# Patient Record
Sex: Male | Born: 1965 | Race: White | Hispanic: No | Marital: Single | Smoking: Never smoker
Health system: Southern US, Community
[De-identification: ages and names within clinical notes are randomized; demographics above are authoritative.]

## PROBLEM LIST (undated history)

## (undated) DIAGNOSIS — M1990 Unspecified osteoarthritis, unspecified site: Secondary | ICD-10-CM

## (undated) DIAGNOSIS — E785 Hyperlipidemia, unspecified: Secondary | ICD-10-CM

## (undated) DIAGNOSIS — K219 Gastro-esophageal reflux disease without esophagitis: Secondary | ICD-10-CM

## (undated) DIAGNOSIS — I10 Essential (primary) hypertension: Secondary | ICD-10-CM

## (undated) DIAGNOSIS — E119 Type 2 diabetes mellitus without complications: Secondary | ICD-10-CM

## (undated) DIAGNOSIS — K5792 Diverticulitis of intestine, part unspecified, without perforation or abscess without bleeding: Secondary | ICD-10-CM

## (undated) HISTORY — DX: Hyperlipidemia, unspecified: E78.5

## (undated) HISTORY — PX: ACHILLES TENDON REPAIR: SUR1153

## (undated) HISTORY — PX: APPENDECTOMY: SHX54

---

## 2012-04-05 ENCOUNTER — Emergency Department: Payer: Self-pay | Admitting: *Deleted

## 2012-04-05 LAB — CBC WITH DIFFERENTIAL/PLATELET
Basophil #: 0.1 10*3/uL (ref 0.0–0.1)
Basophil %: 0.6 %
Eosinophil #: 0.2 10*3/uL (ref 0.0–0.7)
Eosinophil %: 2.7 %
HCT: 45.5 % (ref 40.0–52.0)
HGB: 15.7 g/dL (ref 13.0–18.0)
Lymphocyte #: 2.1 10*3/uL (ref 1.0–3.6)
Lymphocyte %: 23 %
MCH: 30.2 pg (ref 26.0–34.0)
MCHC: 34.5 g/dL (ref 32.0–36.0)
MCV: 88 fL (ref 80–100)
Monocyte #: 0.8 x10 3/mm (ref 0.2–1.0)
Monocyte %: 8.7 %
Neutrophil #: 6 10*3/uL (ref 1.4–6.5)
Neutrophil %: 65 %
Platelet: 170 10*3/uL (ref 150–440)
RBC: 5.2 10*6/uL (ref 4.40–5.90)
RDW: 13.1 % (ref 11.5–14.5)
WBC: 9.3 10*3/uL (ref 3.8–10.6)

## 2012-04-05 LAB — URIC ACID: Uric Acid: 6.8 mg/dL (ref 3.5–7.2)

## 2012-04-05 LAB — SEDIMENTATION RATE: Erythrocyte Sed Rate: 4 mm/hr (ref 0–15)

## 2013-11-13 ENCOUNTER — Emergency Department: Payer: Self-pay | Admitting: Emergency Medicine

## 2013-11-13 LAB — CBC WITH DIFFERENTIAL/PLATELET
Basophil #: 0.1 10*3/uL (ref 0.0–0.1)
Basophil %: 0.8 %
Eosinophil #: 0.3 10*3/uL (ref 0.0–0.7)
Eosinophil %: 3.5 %
HCT: 50.2 % (ref 40.0–52.0)
HGB: 17.3 g/dL (ref 13.0–18.0)
Lymphocyte #: 2.3 10*3/uL (ref 1.0–3.6)
Lymphocyte %: 23.6 %
MCH: 29.2 pg (ref 26.0–34.0)
MCHC: 34.4 g/dL (ref 32.0–36.0)
MCV: 85 fL (ref 80–100)
Monocyte #: 0.8 x10 3/mm (ref 0.2–1.0)
Monocyte %: 8.1 %
Neutrophil #: 6.2 10*3/uL (ref 1.4–6.5)
Neutrophil %: 64 %
Platelet: 216 10*3/uL (ref 150–440)
RBC: 5.92 10*6/uL — ABNORMAL HIGH (ref 4.40–5.90)
RDW: 13.2 % (ref 11.5–14.5)
WBC: 9.6 10*3/uL (ref 3.8–10.6)

## 2013-11-13 LAB — URINALYSIS, COMPLETE
Bacteria: NONE SEEN
Bilirubin,UR: NEGATIVE
Blood: NEGATIVE
Glucose,UR: NEGATIVE mg/dL (ref 0–75)
Ketone: NEGATIVE
Leukocyte Esterase: NEGATIVE
Nitrite: NEGATIVE
Ph: 5 (ref 4.5–8.0)
Protein: 25
RBC,UR: 1 /HPF (ref 0–5)
Specific Gravity: 1.025 (ref 1.003–1.030)
Squamous Epithelial: NONE SEEN
WBC UR: <1 /HPF (ref 0–5)

## 2013-11-13 LAB — COMPREHENSIVE METABOLIC PANEL
Albumin: 4.5 g/dL (ref 3.4–5.0)
Alkaline Phosphatase: 92 U/L
Anion Gap: 3 — ABNORMAL LOW (ref 7–16)
BUN: 14 mg/dL (ref 7–18)
Bilirubin,Total: 1.3 mg/dL — ABNORMAL HIGH (ref 0.2–1.0)
Calcium, Total: 9.3 mg/dL (ref 8.5–10.1)
Chloride: 103 mmol/L (ref 98–107)
Co2: 31 mmol/L (ref 21–32)
Creatinine: 1.14 mg/dL (ref 0.60–1.30)
EGFR (African American): >60
EGFR (Non-African Amer.): >60
Glucose: 117 mg/dL — ABNORMAL HIGH (ref 65–99)
Osmolality: 275 (ref 275–301)
Potassium: 3.9 mmol/L (ref 3.5–5.1)
SGOT(AST): 43 U/L — ABNORMAL HIGH (ref 15–37)
SGPT (ALT): 78 U/L (ref 12–78)
Sodium: 137 mmol/L (ref 136–145)
Total Protein: 8.6 g/dL — ABNORMAL HIGH (ref 6.4–8.2)

## 2013-11-13 LAB — LIPASE, BLOOD: Lipase: 172 U/L (ref 73–393)

## 2013-11-13 LAB — CLOSTRIDIUM DIFFICILE(ARMC)

## 2013-11-16 LAB — STOOL CULTURE

## 2015-01-20 ENCOUNTER — Emergency Department: Payer: Self-pay | Admitting: Emergency Medicine

## 2015-11-22 ENCOUNTER — Encounter: Payer: Self-pay | Admitting: Emergency Medicine

## 2015-11-22 ENCOUNTER — Emergency Department
Admission: EM | Admit: 2015-11-22 | Discharge: 2015-11-22 | Disposition: A | Discharge status: Home / Self Care | Payer: Managed Care, Other (non HMO) | Attending: Emergency Medicine | Admitting: Emergency Medicine

## 2015-11-22 DIAGNOSIS — I10 Essential (primary) hypertension: Secondary | ICD-10-CM | POA: Diagnosis not present

## 2015-11-22 DIAGNOSIS — R42 Dizziness and giddiness: Secondary | ICD-10-CM | POA: Diagnosis present

## 2015-11-22 DIAGNOSIS — R55 Syncope and collapse: Secondary | ICD-10-CM | POA: Insufficient documentation

## 2015-11-22 DIAGNOSIS — G8929 Other chronic pain: Secondary | ICD-10-CM | POA: Insufficient documentation

## 2015-11-22 DIAGNOSIS — E119 Type 2 diabetes mellitus without complications: Secondary | ICD-10-CM | POA: Diagnosis not present

## 2015-11-22 DIAGNOSIS — M25512 Pain in left shoulder: Secondary | ICD-10-CM | POA: Diagnosis not present

## 2015-11-22 HISTORY — DX: Type 2 diabetes mellitus without complications: E11.9

## 2015-11-22 HISTORY — DX: Essential (primary) hypertension: I10

## 2015-11-22 LAB — BASIC METABOLIC PANEL
Anion gap: 9 (ref 5–15)
BUN: 16 mg/dL (ref 6–20)
CO2: 26 mmol/L (ref 22–32)
Calcium: 9.9 mg/dL (ref 8.9–10.3)
Chloride: 98 mmol/L — ABNORMAL LOW (ref 101–111)
Creatinine, Ser: 1.25 mg/dL — ABNORMAL HIGH (ref 0.61–1.24)
GFR calc Af Amer: >60 mL/min (ref >60)
GFR calc non Af Amer: >60 mL/min (ref >60)
Glucose, Bld: 126 mg/dL — ABNORMAL HIGH (ref 65–99)
Potassium: 4.1 mmol/L (ref 3.5–5.1)
Sodium: 133 mmol/L — ABNORMAL LOW (ref 135–145)

## 2015-11-22 LAB — CBC
HCT: 49.6 % (ref 40.0–52.0)
Hemoglobin: 16.8 g/dL (ref 13.0–18.0)
MCH: 29.1 pg (ref 26.0–34.0)
MCHC: 33.8 g/dL (ref 32.0–36.0)
MCV: 85.9 fL (ref 80.0–100.0)
Platelets: 252 10*3/uL (ref 150–440)
RBC: 5.77 MIL/uL (ref 4.40–5.90)
RDW: 12.8 % (ref 11.5–14.5)
WBC: 13.9 10*3/uL — ABNORMAL HIGH (ref 3.8–10.6)

## 2015-11-22 LAB — TROPONIN I: Troponin I: <0.03 ng/mL (ref <0.031)

## 2015-11-22 NOTE — ED Notes (Signed)
Lab notified of add on troponin.

## 2015-11-22 NOTE — Discharge Instructions (Signed)

## 2015-11-22 NOTE — ED Provider Notes (Signed)
[facility] Emergency Department Provider Note  ____________________________________________  Time seen: Approximately 340 PM  I have reviewed the triage vital signs and the nursing notes.   HISTORY  Chief Complaint Dizziness    HPI Edward Quinn is a 49 y.o. male with a history of diabetes and hypertension recently started on lisinopril and HCTZ who is presenting today with near syncope. 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 He says that he was pushing heavy equipment was sweaty and began to feel lightheaded as if she was going to pass out. The patient denies any chest pain or shortness of breath. Said that he removed himself from the environment and took by mouth fluids and began feeling better within about 10-15 minutes. He feels back to his baseline at this point. So denying any chest pain or shortness of breath. 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 Seen by his primary care doctor who sent him into the emergency department today as a precaution. Patient arrives with paperwork as well as EKGs from today and December 19.  Past Medical History  Diagnosis Date  . Diabetes mellitus without complication (HCC)   . Hypertension     There are no active problems to display for this patient.   Past Surgical History  Procedure Laterality Date  . Appendectomy      No current outpatient prescriptions on file.  Allergies Review of patient's allergies indicates no known allergies.  No family history on file.  Social History Social History  Substance Use Topics  . Smoking status: Never Smoker   . Smokeless tobacco: None  . Alcohol Use: Yes    Review of Systems Constitutional: No fever/chills Eyes: No visual changes. ENT: No sore  throat. Cardiovascular: Denies chest pain. Respiratory: Denies shortness of breath. Gastrointestinal: No abdominal pain.  No nausea, no vomiting.  No diarrhea.  No constipation. Genitourinary: Negative for dysuria. Musculoskeletal: Negative for back pain. Skin: Negative for rash. Neurological: Negative for headaches, focal weakness or numbness.  10-point ROS otherwise negative.  ____________________________________________   PHYSICAL EXAM:  VITAL SIGNS: ED Triage Vitals  Enc Vitals Group     BP 11/22/15 1344 136/87 mmHg     Pulse Rate 11/22/15 1344 82     Resp 11/22/15 1344 20     Temp 11/22/15 1346 97.5 F (36.4 C)     Temp Source 11/22/15 1344 Oral     SpO2 11/22/15 1344 98 %     Weight 11/22/15 1344 175 lb (79.379 kg)     Height 11/22/15 1344  (1.676 m)     Head Cir --      Peak Flow --      Pain Score --      Pain Loc --      Pain Edu? --      Excl. in GC? --     Constitutional: Alert and oriented. Well appearing and in no acute distress. Eyes: Conjunctivae are normal. PERRL. EOMI. Head: Atraumatic. Nose: No congestion/rhinnorhea. Mouth/Throat: Mucous membranes are moist.   Neck: No stridor.   Cardiovascular: Normal rate, regular rhythm. Grossly normal heart sounds.  Good peripheral circulation. Respiratory: Normal respiratory effort.  No retractions. Lungs CTAB. Gastrointestinal: Soft and nontender. No distention. No abdominal bruits. No  CVA tenderness. Musculoskeletal: No lower extremity tenderness nor edema.  No joint effusions. Neurologic:  Normal speech and language. No gross focal neurologic deficits are appreciated. No gait instability. Skin:  Skin is warm, dry and intact. No rash noted. Psychiatric: Mood and affect are normal. Speech and behavior are normal.  ____________________________________________   LABS (all labs ordered are listed, but only abnormal results are displayed)  Labs Reviewed  BASIC METABOLIC PANEL - Abnormal; Notable for the  following:    Sodium 133 (*)    Chloride 98 (*)    Glucose, Bld 126 (*)    Creatinine, Ser 1.25 (*)    All other components within normal limits  CBC - Abnormal; Notable for the following:    WBC 13.9 (*)    All other components within normal limits  TROPONIN I   ____________________________________________  EKG  ED ECG REPORT I, Arelia LongestSchaevitz,  David M, the attending physician, personally viewed and interpreted this ECG.   Date: 11/22/2015  EKG Time: 1355  Rate: 79  Rhythm: normal sinus rhythm  Axis: Normal axis  Intervals:none  ST&T Change: No ST segment elevation or depression. No abnormal T-wave inversion.  Compared with EKGs from December 22 in December 1900 on an outpatient basis and they both had a T-wave inversion in lead 3 which can be a normal variant.  ____________________________________________  RADIOLOGY   ____________________________________________   PROCEDURES  ____________________________________________   INITIAL IMPRESSION / ASSESSMENT AND PLAN / ED COURSE  Pertinent labs & imaging results that were available during my care of the patient were reviewed by me and considered in my medical decision making (see chart for details).  Given the context of the episodes this morning a believe that the patient likely had a vasovagal episode. He was in a hot environment had not had sufficient fluid and solid intake prior to working hard this morning. There is also likely some contribution of his new blood pressure medications, however I do think that he should be on these since he says that his previous blood pressures were up in the 160s. Instead of stopping the blood pressure medications we discussed making sure to not skip meals and to make sure he drinks plenty of fluids especially water. The patient does not drink very much water at this time. He understands the plan and is willing to comply. His wife is also the bedside and understands the plan.  Says he plans  to follow up with his primary care doctor in over the next several days. ____________________________________________   FINAL CLINICAL IMPRESSION(S) / ED DIAGNOSES  Near-syncope.    Myrna Blazeravid Matthew Schaevitz, MD 11/22/15 [phone]

## 2015-11-22 NOTE — ED Notes (Signed)
Pt presents with episode of dizziness today at work, pt recently started on 6 new meds some being bp meds. Pt was doing some different physical work today and felt lightheaded. Pt states all sx have resolved at this time, but was advised to come and checked out.

## 2016-04-29 ENCOUNTER — Encounter: Payer: Self-pay | Admitting: Medical Oncology

## 2016-04-29 ENCOUNTER — Emergency Department: Payer: Managed Care, Other (non HMO)

## 2016-04-29 ENCOUNTER — Emergency Department
Admission: EM | Admit: 2016-04-29 | Discharge: 2016-04-29 | Disposition: A | Discharge status: Home / Self Care | Payer: Managed Care, Other (non HMO) | Attending: Emergency Medicine | Admitting: Emergency Medicine

## 2016-04-29 DIAGNOSIS — K5732 Diverticulitis of large intestine without perforation or abscess without bleeding: Secondary | ICD-10-CM | POA: Diagnosis not present

## 2016-04-29 DIAGNOSIS — I10 Essential (primary) hypertension: Secondary | ICD-10-CM | POA: Insufficient documentation

## 2016-04-29 DIAGNOSIS — E119 Type 2 diabetes mellitus without complications: Secondary | ICD-10-CM | POA: Diagnosis not present

## 2016-04-29 DIAGNOSIS — R1030 Lower abdominal pain, unspecified: Secondary | ICD-10-CM

## 2016-04-29 DIAGNOSIS — R1032 Left lower quadrant pain: Secondary | ICD-10-CM | POA: Diagnosis present

## 2016-04-29 LAB — URINALYSIS COMPLETE WITH MICROSCOPIC (ARMC ONLY)
Bacteria, UA: NONE SEEN
Bilirubin Urine: NEGATIVE
Glucose, UA: NEGATIVE mg/dL
Hgb urine dipstick: NEGATIVE
Ketones, ur: NEGATIVE mg/dL
Leukocytes, UA: NEGATIVE
Nitrite: NEGATIVE
Protein, ur: NEGATIVE mg/dL
RBC / HPF: NONE SEEN RBC/hpf (ref 0–5)
Specific Gravity, Urine: 1.021 (ref 1.005–1.030)
Squamous Epithelial / LPF: NONE SEEN
pH: 5 (ref 5.0–8.0)

## 2016-04-29 LAB — COMPREHENSIVE METABOLIC PANEL
ALT: 27 U/L (ref 17–63)
AST: 26 U/L (ref 15–41)
Albumin: 5.1 g/dL — ABNORMAL HIGH (ref 3.5–5.0)
Alkaline Phosphatase: 67 U/L (ref 38–126)
Anion gap: 8 (ref 5–15)
BUN: 12 mg/dL (ref 6–20)
CO2: 28 mmol/L (ref 22–32)
Calcium: 9.9 mg/dL (ref 8.9–10.3)
Chloride: 100 mmol/L — ABNORMAL LOW (ref 101–111)
Creatinine, Ser: 1.07 mg/dL (ref 0.61–1.24)
GFR calc Af Amer: >60 mL/min (ref >60)
GFR calc non Af Amer: >60 mL/min (ref >60)
Glucose, Bld: 118 mg/dL — ABNORMAL HIGH (ref 65–99)
Potassium: 4.1 mmol/L (ref 3.5–5.1)
Sodium: 136 mmol/L (ref 135–145)
Total Bilirubin: 1.4 mg/dL — ABNORMAL HIGH (ref 0.3–1.2)
Total Protein: 8.4 g/dL — ABNORMAL HIGH (ref 6.5–8.1)

## 2016-04-29 LAB — CBC
HCT: 47.3 % (ref 40.0–52.0)
Hemoglobin: 16 g/dL (ref 13.0–18.0)
MCH: 29.4 pg (ref 26.0–34.0)
MCHC: 33.8 g/dL (ref 32.0–36.0)
MCV: 86.9 fL (ref 80.0–100.0)
Platelets: 196 10*3/uL (ref 150–440)
RBC: 5.45 MIL/uL (ref 4.40–5.90)
RDW: 13.2 % (ref 11.5–14.5)
WBC: 15.7 10*3/uL — ABNORMAL HIGH (ref 3.8–10.6)

## 2016-04-29 LAB — LIPASE, BLOOD: Lipase: 30 U/L (ref 11–51)

## 2016-04-29 MED ORDER — HYDROCODONE-ACETAMINOPHEN 5-325 MG PO TABS: 1.0000 | ORAL_TABLET | Freq: Four times a day (QID) | ORAL | Status: DC | PRN

## 2016-04-29 MED ORDER — METRONIDAZOLE 500 MG PO TABS
500.0000 mg | ORAL_TABLET | Freq: Once | ORAL | Status: AC
  Administered 2016-04-29: 500 mg via ORAL
  Filled 2016-04-29: qty 1

## 2016-04-29 MED ORDER — CIPROFLOXACIN HCL 500 MG PO TABS: 500.0000 mg | ORAL_TABLET | Freq: Two times a day (BID) | ORAL | Status: DC

## 2016-04-29 MED ORDER — IOPAMIDOL (ISOVUE-300) INJECTION 61%
100.0000 mL | Freq: Once | INTRAVENOUS | Status: AC | PRN
  Administered 2016-04-29: 100 mL via INTRAVENOUS

## 2016-04-29 MED ORDER — CIPROFLOXACIN HCL 500 MG PO TABS
500.0000 mg | ORAL_TABLET | Freq: Once | ORAL | Status: AC
  Administered 2016-04-29: 500 mg via ORAL
  Filled 2016-04-29: qty 1

## 2016-04-29 MED ORDER — METRONIDAZOLE 500 MG PO TABS: 500.0000 mg | ORAL_TABLET | Freq: Two times a day (BID) | ORAL | Status: DC

## 2016-04-29 MED ORDER — ONDANSETRON HCL 4 MG PO TABS: 4.0000 mg | ORAL_TABLET | Freq: Three times a day (TID) | ORAL | Status: DC | PRN

## 2016-04-29 MED ORDER — DIATRIZOATE MEGLUMINE & SODIUM 66-10 % PO SOLN
15.0000 mL | Freq: Once | ORAL | Status: AC
  Administered 2016-04-29: 15 mL via ORAL

## 2016-04-29 NOTE — Discharge Instructions (Signed)
You are being treated for infection of the large intestine, diverticulitis. Next I return to the emergency room for any worsening pain, black or bloody stools, fever, dizziness or passing out, or any other symptoms concerning to you.   Diverticulitis Diverticulitis is when small pockets that have formed in your colon (large intestine) become infected or swollen. HOME CARE  Follow your doctor's instructions.  Follow a special diet if told by your doctor.  When you feel better, your doctor may tell you to change your diet. You may be told to eat a lot of fiber. Fruits and vegetables are good sources of fiber. Fiber makes it easier to poop (have bowel movements).  Take supplements or probiotics as told by your doctor.  Only take medicines as told by your doctor.  Keep all follow-up visits with your doctor. GET HELP IF:  Your pain does not get better.  You have a hard time eating food.  You are not pooping like normal. GET HELP RIGHT AWAY IF:  Your pain gets worse.  Your problems do not get better.  Your problems suddenly get worse.  You have a fever.  You keep throwing up (vomiting).  You have bloody or black, tarry poop (stool). MAKE SURE YOU:   Understand these instructions.  Will watch your condition.  Will get help right away if you are not doing well or get worse.   This information is not intended to replace advice given to you by your health care provider. Make sure you discuss any questions you have with your health care provider.   Document Released: 05/05/2008 Document Revised: 11/22/2013 Document Reviewed: 10/12/2013 Elsevier Interactive Patient Education [employer]2016 Elsevier Inc.

## 2016-04-29 NOTE — ED Provider Notes (Signed)
[facility]  I accepted care from Dr. Scotty CourtStafford  ____________________________________________    RADIOLOGY All xrays were viewed by me. Imaging interpreted by radiologist.  CT abdomen and pelvis with contrast:   IMPRESSION: Focal sigmoid diverticulitis. No abscess is noted.  ____________________________________________   PROCEDURES  Procedure(s) performed: None  Critical Care performed: None  ____________________________________________   INITIAL IMPRESSION / ASSESSMENT AND PLAN / ED COURSE   Pertinent labs & imaging results that were available during my care of the patient were reviewed by me and considered in my medical decision making (see chart for details).  I discussed diagnosis of diverticulitis with the patient and family member, treatment and follow-up precautions. Patient overall well-appearing, no hypotension, tachycardia, unmanaged pain, and I think patient is okay for outpatient treatment.  CONSULTATIONS: None    Patient / Family / Caregiver informed of clinical course, medical decision-making process, and agree with plan.   I discussed return precautions, follow-up instructions, and discharged instructions with patient and/or family.     ____________________________________________   FINAL CLINICAL IMPRESSION(S) / ED DIAGNOSES  Final diagnoses:  Lower abdominal pain  Diverticulitis of sigmoid colon        Governor Rooksebecca Gaberial Cada, MD 04/29/16 1700

## 2016-04-29 NOTE — ED Notes (Signed)
Pt reports that he has been having lower abd pain/pressure since yesterday am. Reports nausea without vomiting, denies diarrhea.

## 2016-04-29 NOTE — ED Notes (Signed)
Pt up to bathroom without assistance.  Called ct and notified pt finished with contrast.

## 2016-04-29 NOTE — ED Provider Notes (Signed)
[facility] Emergency Department Provider Note  ____________________________________________  Time seen: 1:45 PM  I have reviewed the triage vital signs and the nursing notes.   HISTORY  Chief Complaint Abdominal Pain    HPI Edward Quinn is a 50 y.o. male who complains of lower abdominal pain and pressure since yesterday morning. He has nausea but no vomiting or diarrhea. No fever chills or sweats. No other pains. He is tolerating oral intake has decreased appetite. Feels like he needs to have a bowel movement, although he last had a bowel movement 1 AM today which was unremarkable for him. He also states that he feels like he has the urge to urinate, but his urination is normal and not painful or bloody. No hesitancy or difficulty with stream. Pain is moderate intensity, intermittent, achy, pressure, nonradiating. No aggravating or alleviating factors.    Past Medical History  Diagnosis Date  . Diabetes mellitus without complication (HCC)   . Hypertension      There are no active problems to display for this patient.    Past Surgical History  Procedure Laterality Date  . Appendectomy       No current outpatient prescriptions on file. None  Allergies Review of patient's allergies indicates no known allergies.   No family history on file.  Social History Social History  Substance Use Topics  . Smoking status: Never Smoker   . Smokeless tobacco: None  . Alcohol Use: Yes    Review of Systems  Constitutional:   No fever or chills.  Eyes:   No vision changes.  ENT:   No sore throat. No rhinorrhea. Cardiovascular:   No chest pain. Respiratory:   No dyspnea or cough. Gastrointestinal:   Positive abdominal pain as above without vomiting and diarrhea.  Genitourinary:   Negative for dysuria or difficulty urinating. Musculoskeletal:   Negative for focal pain or swelling Neurological:   Negative for headaches 10-point ROS otherwise  negative.  ____________________________________________   PHYSICAL EXAM:  VITAL SIGNS: ED Triage Vitals  Enc Vitals Group     BP 04/29/16 1033 120/87 mmHg     Pulse Rate 04/29/16 1033 84     Resp 04/29/16 1033 16     Temp 04/29/16 1033 98.2 F (36.8 C)     Temp Source 04/29/16 1033 Oral     SpO2 04/29/16 1033 98 %     Weight 04/29/16 1033 165 lb (74.844 kg)     Height 04/29/16 1033 5\' 6"  (1.676 m)     Head Cir --      Peak Flow --      Pain Score 04/29/16 1034 7     Pain Loc --      Pain Edu? --      Excl. in GC? --     Vital signs reviewed, nursing assessments reviewed.   Constitutional:   Alert and oriented. Well appearing and in no distress. Eyes:   No scleral icterus. No conjunctival pallor. PERRL. EOMI.  No nystagmus. ENT   Head:   Normocephalic and atraumatic.   Nose:   No congestion/rhinnorhea. No septal hematoma   Mouth/Throat:   MMM, no pharyngeal erythema. No peritonsillar mass.    Neck:   No stridor. No SubQ emphysema. No meningismus. Hematological/Lymphatic/Immunilogical:   No cervical lymphadenopathy. Cardiovascular:   RRR. Symmetric bilateral radial and DP pulses.  No murmurs.  Respiratory:   Normal respiratory effort without tachypnea nor retractions. Breath sounds are clear and equal bilaterally. No wheezes/rales/rhonchi. Gastrointestinal:  Soft with suprapubic and left lower quadrant tenderness. Non distended. There is no CVA tenderness.  No rebound, rigidity, or guarding. Genitourinary:   deferred Musculoskeletal:   Nontender with normal range of motion in all extremities. No joint effusions.  No lower extremity tenderness.  No edema. Neurologic:   Normal speech and language.  CN 2-10 normal. Motor grossly intact. No gross focal neurologic deficits are appreciated.  Skin:    Skin is warm, dry and intact. No rash noted.  No petechiae, purpura, or bullae.  ____________________________________________    LABS (pertinent  positives/negatives) (all labs ordered are listed, but only abnormal results are displayed) Labs Reviewed  COMPREHENSIVE METABOLIC PANEL - Abnormal; Notable for the following:    Chloride 100 (*)    Glucose, Bld 118 (*)    Total Protein 8.4 (*)    Albumin 5.1 (*)    Total Bilirubin 1.4 (*)    All other components within normal limits  CBC - Abnormal; Notable for the following:    WBC 15.7 (*)    All other components within normal limits  URINALYSIS COMPLETEWITH MICROSCOPIC ([facility] ONLY) - Abnormal; Notable for the following:    Color, Urine YELLOW (*)    APPearance CLEAR (*)    All other components within normal limits  LIPASE, BLOOD   ____________________________________________   EKG    ____________________________________________    RADIOLOGY  CT abdomen and pelvis pending  ____________________________________________   PROCEDURES   ____________________________________________   INITIAL IMPRESSION / ASSESSMENT AND PLAN / ED COURSE  Pertinent labs & imaging results that were available during my care of the patient were reviewed by me and considered in my medical decision making (see chart for details).  Patient presents with lower abdominal pain and tenderness. He's had previous abdominal surgery. Vital signs are normal, initial lab workup is normal, but due to ongoing symptoms and prior surgeries and diabetes which could result in some immunocompromise, we'll proceed with CT scan of the abdomen pelvis. If this does not reveal any severe abnormalities, the patient appears to be stable for discharge home and close follow-up with primary care given that he is not in distress and well-appearing with normal vital signs.  Care the patient signed on the Dr. Shaune Pollack at 3:30 PM   ____________________________________________   FINAL CLINICAL IMPRESSION(S) / ED DIAGNOSES  Final diagnoses:  Lower abdominal pain       Portions of this note were generated with dragon  dictation software. Dictation errors may occur despite best attempts at proofreading.   Sharman Cheek, MD 04/29/16 [phone]

## 2016-05-03 ENCOUNTER — Encounter: Payer: Self-pay | Admitting: Emergency Medicine

## 2016-05-03 ENCOUNTER — Emergency Department: Payer: Managed Care, Other (non HMO)

## 2016-05-03 ENCOUNTER — Observation Stay
Admission: EM | Admit: 2016-05-03 | Discharge: 2016-05-05 | Disposition: A | Discharge status: Home / Self Care | Payer: Managed Care, Other (non HMO) | Attending: Surgery | Admitting: Surgery

## 2016-05-03 DIAGNOSIS — I10 Essential (primary) hypertension: Secondary | ICD-10-CM | POA: Diagnosis not present

## 2016-05-03 DIAGNOSIS — K760 Fatty (change of) liver, not elsewhere classified: Secondary | ICD-10-CM | POA: Insufficient documentation

## 2016-05-03 DIAGNOSIS — I700 Atherosclerosis of aorta: Secondary | ICD-10-CM | POA: Diagnosis not present

## 2016-05-03 DIAGNOSIS — Z7984 Long term (current) use of oral hypoglycemic drugs: Secondary | ICD-10-CM | POA: Diagnosis not present

## 2016-05-03 DIAGNOSIS — Z9049 Acquired absence of other specified parts of digestive tract: Secondary | ICD-10-CM | POA: Insufficient documentation

## 2016-05-03 DIAGNOSIS — K5720 Diverticulitis of large intestine with perforation and abscess without bleeding: Principal | ICD-10-CM | POA: Insufficient documentation

## 2016-05-03 DIAGNOSIS — N400 Enlarged prostate without lower urinary tract symptoms: Secondary | ICD-10-CM | POA: Diagnosis not present

## 2016-05-03 DIAGNOSIS — Z79899 Other long term (current) drug therapy: Secondary | ICD-10-CM | POA: Diagnosis not present

## 2016-05-03 DIAGNOSIS — N179 Acute kidney failure, unspecified: Secondary | ICD-10-CM

## 2016-05-03 DIAGNOSIS — E860 Dehydration: Secondary | ICD-10-CM | POA: Diagnosis not present

## 2016-05-03 DIAGNOSIS — E119 Type 2 diabetes mellitus without complications: Secondary | ICD-10-CM | POA: Insufficient documentation

## 2016-05-03 LAB — CBC WITH DIFFERENTIAL/PLATELET
Basophils Absolute: 0 10*3/uL (ref 0–0.1)
Basophils Relative: 1 %
Eosinophils Absolute: 0.1 10*3/uL (ref 0–0.7)
Eosinophils Relative: 1 %
HCT: 43.4 % (ref 40.0–52.0)
Hemoglobin: 15 g/dL (ref 13.0–18.0)
Lymphocytes Relative: 13 %
Lymphs Abs: 1.3 10*3/uL (ref 1.0–3.6)
MCH: 29.7 pg (ref 26.0–34.0)
MCHC: 34.5 g/dL (ref 32.0–36.0)
MCV: 86.1 fL (ref 80.0–100.0)
Monocytes Absolute: 0.6 10*3/uL (ref 0.2–1.0)
Monocytes Relative: 6 %
Neutro Abs: 7.9 10*3/uL — ABNORMAL HIGH (ref 1.4–6.5)
Neutrophils Relative %: 79 %
Platelets: 236 10*3/uL (ref 150–440)
RBC: 5.05 MIL/uL (ref 4.40–5.90)
RDW: 13.1 % (ref 11.5–14.5)
WBC: 10 10*3/uL (ref 3.8–10.6)

## 2016-05-03 LAB — COMPREHENSIVE METABOLIC PANEL
ALT: 24 U/L (ref 17–63)
AST: 35 U/L (ref 15–41)
Albumin: 4.7 g/dL (ref 3.5–5.0)
Alkaline Phosphatase: 53 U/L (ref 38–126)
Anion gap: 13 (ref 5–15)
BUN: 19 mg/dL (ref 6–20)
CO2: 22 mmol/L (ref 22–32)
Calcium: 9 mg/dL (ref 8.9–10.3)
Chloride: 102 mmol/L (ref 101–111)
Creatinine, Ser: 1.36 mg/dL — ABNORMAL HIGH (ref 0.61–1.24)
GFR calc Af Amer: >60 mL/min (ref >60)
GFR calc non Af Amer: 59 mL/min — ABNORMAL LOW (ref >60)
Glucose, Bld: 182 mg/dL — ABNORMAL HIGH (ref 65–99)
Potassium: 3.8 mmol/L (ref 3.5–5.1)
Sodium: 137 mmol/L (ref 135–145)
Total Bilirubin: 0.8 mg/dL (ref 0.3–1.2)
Total Protein: 7.7 g/dL (ref 6.5–8.1)

## 2016-05-03 LAB — GLUCOSE, CAPILLARY: Glucose-Capillary: 122 mg/dL — ABNORMAL HIGH (ref 65–99)

## 2016-05-03 MED ORDER — IOPAMIDOL (ISOVUE-300) INJECTION 61%
100.0000 mL | Freq: Once | INTRAVENOUS | Status: AC | PRN
  Administered 2016-05-03: 100 mL via INTRAVENOUS

## 2016-05-03 MED ORDER — MORPHINE SULFATE (PF) 2 MG/ML IV SOLN: 2.0000 mg | INTRAVENOUS | Status: DC | PRN

## 2016-05-03 MED ORDER — PROMETHAZINE HCL 25 MG/ML IJ SOLN
12.5000 mg | Freq: Once | INTRAMUSCULAR | Status: DC
  Filled 2016-05-03: qty 1

## 2016-05-03 MED ORDER — KETOROLAC TROMETHAMINE 30 MG/ML IJ SOLN: 30.0000 mg | Freq: Four times a day (QID) | INTRAMUSCULAR | Status: DC | PRN

## 2016-05-03 MED ORDER — ACETAMINOPHEN 650 MG RE SUPP
650.0000 mg | Freq: Four times a day (QID) | RECTAL | Status: DC | PRN
Start: 2016-05-03 — End: 2016-05-05

## 2016-05-03 MED ORDER — OXYCODONE HCL 5 MG PO TABS: 5.0000 mg | ORAL_TABLET | ORAL | Status: DC | PRN

## 2016-05-03 MED ORDER — PIPERACILLIN-TAZOBACTAM 3.375 G IVPB
3.3750 g | Freq: Three times a day (TID) | INTRAVENOUS | Status: DC
  Administered 2016-05-04 – 2016-05-05 (×4): 3.375 g via INTRAVENOUS
  Filled 2016-05-03 (×6): qty 50

## 2016-05-03 MED ORDER — INSULIN ASPART 100 UNIT/ML ~~LOC~~ SOLN: 0.0000 [IU] | Freq: Three times a day (TID) | SUBCUTANEOUS | Status: DC

## 2016-05-03 MED ORDER — SODIUM CHLORIDE 0.9 % IV BOLUS (SEPSIS)
1000.0000 mL | Freq: Once | INTRAVENOUS | Status: AC
  Administered 2016-05-03: 1000 mL via INTRAVENOUS

## 2016-05-03 MED ORDER — METOCLOPRAMIDE HCL 5 MG/ML IJ SOLN
10.0000 mg | Freq: Once | INTRAMUSCULAR | Status: AC
Start: 2016-05-03 — End: 2016-05-03
  Administered 2016-05-03: 10 mg via INTRAVENOUS
  Filled 2016-05-03: qty 2

## 2016-05-03 MED ORDER — ACETAMINOPHEN 325 MG PO TABS
650.0000 mg | ORAL_TABLET | Freq: Four times a day (QID) | ORAL | Status: DC | PRN
  Administered 2016-05-03 – 2016-05-04 (×2): 650 mg via ORAL
  Filled 2016-05-03 (×2): qty 2

## 2016-05-03 MED ORDER — PIPERACILLIN-TAZOBACTAM 3.375 G IVPB 30 MIN
3.3750 g | Freq: Once | INTRAVENOUS | Status: AC
  Administered 2016-05-03: 3.375 g via INTRAVENOUS
  Filled 2016-05-03: qty 50

## 2016-05-03 MED ORDER — SODIUM CHLORIDE 0.9 % IV BOLUS (SEPSIS): 1000.0000 mL | Freq: Once | INTRAVENOUS | Status: AC

## 2016-05-03 MED ORDER — ONDANSETRON HCL 4 MG/2ML IJ SOLN: 4.0000 mg | Freq: Four times a day (QID) | INTRAMUSCULAR | Status: DC | PRN

## 2016-05-03 MED ORDER — DIATRIZOATE MEGLUMINE & SODIUM 66-10 % PO SOLN
15.0000 mL | Freq: Once | ORAL | Status: AC
  Administered 2016-05-03: 15 mL via ORAL

## 2016-05-03 MED ORDER — DIPHENHYDRAMINE HCL 25 MG PO CAPS
50.0000 mg | ORAL_CAPSULE | Freq: Two times a day (BID) | ORAL | Status: DC | PRN
  Administered 2016-05-03: 50 mg via ORAL
  Filled 2016-05-03: qty 2

## 2016-05-03 MED ORDER — LACTATED RINGERS IV SOLN
INTRAVENOUS | Status: DC
  Administered 2016-05-03 – 2016-05-05 (×4): via INTRAVENOUS

## 2016-05-03 MED ORDER — FAMOTIDINE IN NACL 20-0.9 MG/50ML-% IV SOLN
20.0000 mg | Freq: Two times a day (BID) | INTRAVENOUS | Status: DC
  Administered 2016-05-03 – 2016-05-05 (×4): 20 mg via INTRAVENOUS
  Filled 2016-05-03 (×6): qty 50

## 2016-05-03 MED ORDER — ENOXAPARIN SODIUM 40 MG/0.4ML ~~LOC~~ SOLN
40.0000 mg | SUBCUTANEOUS | Status: DC
  Filled 2016-05-03: qty 0.4

## 2016-05-03 MED ORDER — ONDANSETRON 8 MG PO TBDP: 4.0000 mg | ORAL_TABLET | Freq: Four times a day (QID) | ORAL | Status: DC | PRN

## 2016-05-03 NOTE — ED Notes (Signed)
Patient states was seen 4 days ago for abd pain, diagnosed with diverticulitis, on antibiotic, pain improved, continues nauseated.

## 2016-05-03 NOTE — ED Provider Notes (Addendum)
[facility] Emergency Department Provider Note  ____________________________________________   I have reviewed the triage vital signs and the nursing notes.   HISTORY  Chief Complaint Abdominal Pain    HPI Edward Quinn is a 50 y.o. male who was diagnosed with focal sigmoid diverticulosis onMay 30 presents today with ongoing discomfort which is somewhat improved. He states that unless he walks the wrong way or touches it the wrong way his abdominal pain is feeling better. He has not vomited but his nausea is getting worse. He is not helped by Zofran is why he is here. He does have ongoing watery diarrhea with no bleeding. Denies any fever or chills. He states the pain is still in the left lower quadrant. As a sharp pain does not radiate.      Past Medical History  Diagnosis Date  . Diabetes mellitus without complication (HCC)   . Hypertension     There are no active problems to display for this patient.   Past Surgical History  Procedure Laterality Date  . Appendectomy      Current Outpatient Rx  Name  Route  Sig  Dispense  Refill  . ciprofloxacin (CIPRO) 500 MG tablet   Oral   Take 1 tablet (500 mg total) by mouth 2 (two) times daily.   20 tablet   0   . HYDROcodone-acetaminophen (NORCO/VICODIN) 5-325 MG tablet   Oral   Take 1 tablet by mouth every 6 (six) hours as needed for moderate pain.   10 tablet   0   . metroNIDAZOLE (FLAGYL) 500 MG tablet   Oral   Take 1 tablet (500 mg total) by mouth 2 (two) times daily.   20 tablet   0   . ondansetron (ZOFRAN) 4 MG tablet   Oral   Take 1 tablet (4 mg total) by mouth every 8 (eight) hours as needed for nausea or vomiting.   10 tablet   0     Allergies Review of patient's allergies indicates no known allergies.  No family history on file.  Social History Social History  Substance Use Topics  . Smoking status: Never Smoker   . Smokeless tobacco: None  . Alcohol Use: Yes     Review of Systems }Constitutional: No fever/chills Eyes: No visual changes. ENT: No sore throat. No stiff neck no neck pain Cardiovascular: Denies chest pain. Respiratory: Denies shortness of breath. Gastrointestinal:   no vomiting.  Positive watery diarrhea.  No constipation. Genitourinary: Negative for dysuria. Musculoskeletal: Negative lower extremity swelling Skin: Negative for rash. Neurological: Negative for headaches, focal weakness or numbness. 10-point ROS otherwise negative.  ____________________________________________   PHYSICAL EXAM:  VITAL SIGNS: ED Triage Vitals  Enc Vitals Group     BP 05/03/16 1417 108/75 mmHg     Pulse Rate 05/03/16 1417 76     Resp 05/03/16 1417 20     Temp 05/03/16 1417 98.3 F (36.8 C)     Temp Source 05/03/16 1417 Oral     SpO2 05/03/16 1417 98 %     Weight 05/03/16 1417 170 lb (77.111 kg)     Height 05/03/16 1417  (1.676 m)     Head Cir --      Peak Flow --      Pain Score 05/03/16 1419 2     Pain Loc --      Pain Edu? --      Excl. in GC? --     Constitutional: Alert and oriented.  Well appearing and in no acute distress. Eyes: Conjunctivae are normal. PERRL. EOMI. Head: Atraumatic. Nose: No congestion/rhinnorhea. Mouth/Throat: Mucous membranes are moist.  Oropharynx non-erythematous. Neck: No stridor.   Nontender with no meningismus Cardiovascular: Normal rate, regular rhythm. Grossly normal heart sounds.  Good peripheral circulation. Respiratory: Normal respiratory effort.  No retractions. Lungs CTAB. Abdominal: Positive tenderness to palpation left lower quadrant with voluntary guarding no rebound, soft, no peritoneal signs  Back:  There is no focal tenderness or step off there is no midline tenderness there are no lesions noted. there is no CVA tenderness Musculoskeletal: No lower extremity tenderness. No joint effusions, no DVT signs strong distal pulses no edema Neurologic:  Normal speech and language. No gross  focal neurologic deficits are appreciated.  Skin:  Skin is warm, dry and intact. No rash noted. Psychiatric: Mood and affect are normal. Speech and behavior are normal.  ____________________________________________   LABS (all labs ordered are listed, but only abnormal results are displayed)  Labs Reviewed  CBC WITH DIFFERENTIAL/PLATELET - Abnormal; Notable for the following:    Neutro Abs 7.9 (*)    All other components within normal limits  COMPREHENSIVE METABOLIC PANEL - Abnormal; Notable for the following:    Glucose, Bld 182 (*)    Creatinine, Ser 1.36 (*)    GFR calc non Af Amer 59 (*)    All other components within normal limits   ____________________________________________  EKG  I personally interpreted any EKGs ordered by me or triage  ____________________________________________  RADIOLOGY  I reviewed any imaging ordered by me or triage that were performed during my shift and, if possible, patient and/or family made aware of any abnormal findings. ____________________________________________   PROCEDURES  Procedure(s) performed: None  Critical Care performed: None  ____________________________________________   INITIAL IMPRESSION / ASSESSMENT AND PLAN / ED COURSE  Pertinent labs & imaging results that were available during my care of the patient were reviewed by me and considered in my medical decision making (see chart for details).  Patient with ongoing nausea, white count is increased, vital signs reassuring but I am concerned that he has this much tenderness or days out from ongoing antibiotic use. After discussion with the patient we will obtain repeat CT to ensure that there is been no abscess. If negative I am hopeful that we can get him home with good anti-emetics.  ----------------------------------------- 6:40 PM on 05/03/2016 -----------------------------------------  Patient does have a microperforation with contained free air near the  colon. This is a new development. It doesn't think indicated that he is failed outpatient therapy. We'll give him Zosyn here I did discuss with Dr. Excell Seltzerooper who agrees with plan admission. Appreciate surgical consult. ____________________________________________   FINAL CLINICAL IMPRESSION(S) / ED DIAGNOSES  Final diagnoses:  None      This chart was dictated using voice recognition software.  Despite best efforts to proofread,  errors can occur which can change meaning.     Jeanmarie PlantJames A Shanette Tamargo, MD 05/03/16 1721  Jeanmarie PlantJames A Ginia Rudell, MD 05/03/16 [phone]

## 2016-05-03 NOTE — H&P (Signed)
Patient ID: Edward BottomRobert Quinn, male   DOB: 04/16/1966, 50 y.o.   MRN: [id number]  History of Present Illness Edward Quinn is a 50 y.o. male with abdominal pain and persistent nausea. Easily came to the emergency room 4 days ago with abdominal pain and nausea. And workup revealed evidence of focal diverticulitis and was sent home with Cipro and Flagyl as an outpatient. Now he comes back again to the emergency room complaining of increased nausea and increased fatigue. He was just not getting any better. He states that his abdominal pain is mild and is located in the left lower quadrant, is dull and is actually improving as compared to 4 days ago. He also reports having some increasing frequency and bowel movements. Please note that this is the first episode of diverticulitis and the patient has never had a colonoscopy. Only abdominal operation is an appendectomy. He is a well-controlled diabetic and hypertensive. He is able to perform more than 4 Mets of activity without any shortness of breath or chest pain. He denies any fevers or chills. He admits having decrease appetite .  Past Medical History Past Medical History  Diagnosis Date  . Diabetes mellitus without complication (HCC)   . Hypertension      Past Surgical History  Procedure Laterality Date  . Appendectomy      No Known Allergies  Current Facility-Administered Medications  Medication Dose Route Frequency Provider Last Rate Last Dose  . acetaminophen (TYLENOL) tablet 650 mg  650 mg Oral Q6H PRN Diego Ronnette JuniperF Pabon, MD       Or  . acetaminophen (TYLENOL) suppository 650 mg  650 mg Rectal Q6H PRN Diego F Pabon, MD      . enoxaparin (LOVENOX) injection 40 mg  40 mg Subcutaneous Q24H Diego F Pabon, MD      . famotidine (PEPCID) IVPB 20 mg premix  20 mg Intravenous Q12H Diego F Pabon, MD      . ketorolac (TORADOL) 30 MG/ML injection 30 mg  30 mg Intravenous Q6H PRN Diego F Pabon, MD      . lactated ringers infusion   Intravenous Continuous Diego F  Pabon, MD      . morphine 2 MG/ML injection 2 mg  2 mg Intravenous Q2H PRN Diego F Pabon, MD      . ondansetron (ZOFRAN-ODT) disintegrating tablet 4 mg  4 mg Oral Q6H PRN Diego F Pabon, MD       Or  . ondansetron (ZOFRAN) injection 4 mg  4 mg Intravenous Q6H PRN Diego F Pabon, MD      . oxyCODONE (Oxy IR/ROXICODONE) immediate release tablet 5-10 mg  5-10 mg Oral Q4H PRN Diego F Pabon, MD      . sodium chloride 0.9 % bolus 1,000 mL  1,000 mL Intravenous Once Leafy Roiego F Pabon, MD       Current Outpatient Prescriptions  Medication Sig Dispense Refill  . lisinopril (PRINIVIL,ZESTRIL) 10 MG tablet Take 10 mg by mouth daily.    Marland Kitchen. lovastatin (MEVACOR) 10 MG tablet Take 10 mg by mouth at bedtime.    . metFORMIN (GLUCOPHAGE) 500 MG tablet Take 500 mg by mouth 2 (two) times daily.    . ciprofloxacin (CIPRO) 500 MG tablet Take 1 tablet (500 mg total) by mouth 2 (two) times daily. 20 tablet 0  . HYDROcodone-acetaminophen (NORCO/VICODIN) 5-325 MG tablet Take 1 tablet by mouth every 6 (six) hours as needed for moderate pain. 10 tablet 0  . metroNIDAZOLE (FLAGYL) 500 MG tablet Take  1 tablet (500 mg total) by mouth 2 (two) times daily. 20 tablet 0  . ondansetron (ZOFRAN) 4 MG tablet Take 1 tablet (4 mg total) by mouth every 8 (eight) hours as needed for nausea or vomiting. 10 tablet 0    Family History No family history on file.    Social History Social History  Substance Use Topics  . Smoking status: Never Smoker   . Smokeless tobacco: None  . Alcohol Use: Yes       ROS 10 pt ROS performed and it was otherwise neg  Physical Exam Blood pressure 117/98, pulse 75, temperature 98.3 F (36.8 C), temperature source Oral, resp. rate 20, height  (1.676 m), weight 77.111 kg (170 lb), SpO2 100 %.  CONSTITUTIONAL: NAD, dry mucosa EYES: Pupils equal, round, and reactive to light, Sclera non-icteric. EARS, NOSE, MOUTH AND THROAT: The oropharynx is clear. Oral mucosa is pink and  dry. Hearing is  intact to voice.  NECK: Trachea is midline, and there is no jugular venous distension. Thyroid is without palpable abnormalities. LYMPH NODES:  Lymph nodes in the neck are not enlarged. RESPIRATORY:  Lungs are clear, and breath sounds are equal bilaterally. Normal respiratory effort without pathologic use of accessory muscles. CARDIOVASCULAR: Heart is regular without murmurs, gallops, or rubs. GI: The abdomen is  soft, mild tenderness on LLQ, no peritonitis .,nondistended. There were no palpable masses. There was no hepatosplenomegaly. There were normal bowel sounds. GU: deferred MUSCULOSKELETAL:  Normal muscle strength and tone in all four extremities.    SKIN: Skin turgor is normal. There are no pathologic skin lesions.  NEUROLOGIC:  Motor and sensation is grossly normal.  Cranial nerves are grossly intact. PSYCH:  Alert and oriented to person, place and time. Affect is normal.  Data Reviewed   I have personally reviewed the patient's imaging and medical records.    Assessment/Plan Diverticulitis with contained microperforation. CT scan personal review and compare to the one 4 days ago there is no evidence of a small amount of extraluminal air. Clinically he is nontoxic and there is no evidence of peritonitis. Plan will be for admission with IV antibiotics, he is also dehydrated so we'll go ahead and start some crystalloids and. He's got an acute kidney injury with creatinine of 1.3 which is a bump s from his baseline. We'll recheck a creatinine the morning. No need for emergent surgical intervention. Discussed with him in detail that some point in time she'll probably benefit from an elective sigmoid colectomy after he gets a colonoscopy in 6 weeks. No need for emergent surgical intervention. Extensive counseling provided   Sterling Big, MD FACS  Diego F Pabon 05/03/2016, 7:36 PM

## 2016-05-03 NOTE — Progress Notes (Signed)
Pharmacy Antibiotic Note  Britt BottomRobert Higley is a 50 y.o. male admitted on 05/03/2016 with intra-abdominal infection.  Pharmacy has been consulted for piperacillin/tazobactam dosing.  Plan: Piperacillin/tazobactam 3.375 g IV q8h EI  Height: 5\' 6"  (167.6 cm) Weight: 170 lb (77.111 kg) IBW/kg (Calculated) : 63.8  Temp (24hrs), Avg:98.4 F (36.9 C), Min:98.3 F (36.8 C), Max:98.4 F (36.9 C)   Recent Labs Lab 04/29/16 1038 05/03/16 1424  WBC 15.7* 10.0  CREATININE 1.07 1.36*    Estimated Creatinine Clearance: 63.5 mL/min (by C-G formula based on Cr of 1.36).    No Known Allergies  Antimicrobials this admission: Piperacillin/tazobactam 6/3 >>   Dose adjustments this admission:  Microbiology results: None  Thank you for allowing pharmacy to be a part of this patient's care.  Cindi CarbonMary M Avner Stroder, PharmD Clinical Pharmacist 05/03/2016 9:29 PM

## 2016-05-04 ENCOUNTER — Encounter: Payer: Self-pay | Admitting: *Deleted

## 2016-05-04 DIAGNOSIS — K5720 Diverticulitis of large intestine with perforation and abscess without bleeding: Secondary | ICD-10-CM | POA: Diagnosis not present

## 2016-05-04 LAB — GLUCOSE, CAPILLARY
Glucose-Capillary: 99 mg/dL (ref 65–99)
Glucose-Capillary: 75 mg/dL (ref 65–99)
Glucose-Capillary: 151 mg/dL — ABNORMAL HIGH (ref 65–99)
Glucose-Capillary: 98 mg/dL (ref 65–99)

## 2016-05-04 LAB — BASIC METABOLIC PANEL
Anion gap: 7 (ref 5–15)
BUN: 11 mg/dL (ref 6–20)
CO2: 27 mmol/L (ref 22–32)
Calcium: 8.4 mg/dL — ABNORMAL LOW (ref 8.9–10.3)
Chloride: 106 mmol/L (ref 101–111)
Creatinine, Ser: 1.07 mg/dL (ref 0.61–1.24)
GFR calc Af Amer: >60 mL/min (ref >60)
GFR calc non Af Amer: >60 mL/min (ref >60)
Glucose, Bld: 94 mg/dL (ref 65–99)
Potassium: 3.6 mmol/L (ref 3.5–5.1)
Sodium: 140 mmol/L (ref 135–145)

## 2016-05-04 LAB — CBC
HCT: 39 % — ABNORMAL LOW (ref 40.0–52.0)
Hemoglobin: 13.6 g/dL (ref 13.0–18.0)
MCH: 29.8 pg (ref 26.0–34.0)
MCHC: 35 g/dL (ref 32.0–36.0)
MCV: 85.2 fL (ref 80.0–100.0)
Platelets: 189 10*3/uL (ref 150–440)
RBC: 4.58 MIL/uL (ref 4.40–5.90)
RDW: 13.1 % (ref 11.5–14.5)
WBC: 7.9 10*3/uL (ref 3.8–10.6)

## 2016-05-04 LAB — HEMOGLOBIN A1C: Hgb A1c MFr Bld: 6.1 % — ABNORMAL HIGH (ref 4.0–6.0)

## 2016-05-04 MED ORDER — DIPHENHYDRAMINE HCL 25 MG PO CAPS
25.0000 mg | ORAL_CAPSULE | Freq: Two times a day (BID) | ORAL | Status: DC | PRN
  Administered 2016-05-04: 25 mg via ORAL
  Filled 2016-05-04: qty 1

## 2016-05-04 NOTE — Progress Notes (Signed)
Pt has ambulated twice around the nursing station walking at total of 320 ft. Will continue to encourage ambulation.  Karsten RoLauren E Hobbs

## 2016-05-04 NOTE — Progress Notes (Signed)
CC: Perforated diverticulitis Subjective: This patient with microperforation of his sigmoid colon secondary to diverticulitis. He failed outpatient by mouth therapy and is been admitted the hospital for IV antibiotics. His history is been reviewed both with Dr. Valaria Good and with the patient and chart. He feels better today has minimal if any pain and no nausea or vomiting. No fevers or chills  Objective: Vital signs in last 24 hours: Temp:  [97.8 F (36.6 C)-98.4 F (36.9 C)] 97.8 F (36.6 C) (06/04 0451) Pulse Rate:  [57-76] 57 (06/04 0451) Resp:  [18-20] 18 (06/04 0451) BP: (108-129)/(71-98) 115/71 mmHg (06/04 0451) SpO2:  [97 %-100 %] 98 % (06/04 0451) Weight:  [170 lb (77.111 kg)] 170 lb (77.111 kg) (06/03 1417) Last BM Date: 05/01/16  Intake/Output from previous day: 06/03 0701 - 06/04 0700 In: 240 [P.O.:240] Out: -  Intake/Output this shift:    Physical exam:  Vital signs are stable personally reviewed and he is afebrile. Appears in no acute distress abdomen is soft and minimally tender in the left lower quadrant without peritoneal signs calves are nontender no icterus no jaundice  Lab Results: CBC   Recent Labs  05/03/16 1424 05/04/16 0609  WBC 10.0 7.9  HGB 15.0 13.6  HCT 43.4 39.0*  PLT 236 189   BMET  Recent Labs  05/03/16 1424 05/04/16 0609  NA 137 140  K 3.8 3.6  CL 102 106  CO2 22 27  GLUCOSE 182* 94  BUN 19 11  CREATININE 1.36* 1.07  CALCIUM 9.0 8.4*   PT/INR No results for input(s): LABPROT, INR in the last 72 hours. ABG No results for input(s): PHART, HCO3 in the last 72 hours.  Invalid input(s): PCO2, PO2  Studies/Results: Ct Abdomen Pelvis W Contrast  05/03/2016  CLINICAL DATA:  50 year old male with continued abdominal/pelvic pain and increasing nausea following diagnosis of sigmoid diverticulitis on 04/29/2016. EXAM: CT ABDOMEN AND PELVIS WITH CONTRAST TECHNIQUE: Multidetector CT imaging of the abdomen and pelvis was performed using  the standard protocol following bolus administration of intravenous contrast. CONTRAST:  ISOVUE-300 IOPAMIDOL (ISOVUE-300) INJECTION 61% COMPARISON:  04/29/2016 FINDINGS: Lower chest:  Unremarkable Hepatobiliary: Hepatic steatosis identified without focal hepatic lesions. The gallbladder is unremarkable. There is no evidence of biliary dilatation. Pancreas: Unremarkable Spleen: Unremarkable Adrenals/Urinary Tract: The kidneys, adrenal glands and bladder are unremarkable. Stomach/Bowel: Focal diverticulitis changes of the mid sigmoid colon again noted now with small adjacent focus of extraluminal gas. Inflammation in this area has slightly decreased. There is no other evidence of pneumoperitoneum or abscess. No bowel obstruction or focal bowel wall thickening identified. Vascular/Lymphatic: Aortic atherosclerotic calcifications noted without aneurysm. Reproductive: Prostate enlargement and calcifications again noted. Other: No free fluid Musculoskeletal: No acute or suspicious abnormalities. IMPRESSION: Mid sigmoid colonic diverticulitis again identified with new focus of adjacent extraluminal gas. Slightly decreased inflammation. No other evidence of pneumoperitoneum or abscess. Hepatic steatosis. Aortic atherosclerosis without aneurysm. Electronically Signed   By: Harmon Pier M.D.   On: 05/03/2016 18:28    Anti-infectives: Anti-infectives    Start     Dose/Rate Route Frequency Ordered Stop   05/04/16 0600  piperacillin-tazobactam (ZOSYN) IVPB 3.375 g     3.375 g 12.5 mL/hr over 240 Minutes Intravenous Every 8 hours 05/03/16 2128     05/03/16 1845  piperacillin-tazobactam (ZOSYN) IVPB 3.375 g     3.375 g 100 mL/hr over 30 Minutes Intravenous  Once 05/03/16 1838 05/03/16 1927      Assessment/Plan:  White blood cell count is  7.9 and he is doing very well. Will continue IV antibiotics today and likely be discharged tomorrow on oral antibiotics he has been on Cipro and Flagyl at home but failed  that outpatient therapy although he was only on the antibiotics for essentially 3 days prior to returning to the emergency room. Without a mind I will refill his Cipro Flagyl I see no need to change those antibiotics at this point and he can follow up in our office next week.  Lattie Hawichard E Cooper, MD, FACS  05/04/2016

## 2016-05-05 LAB — GLUCOSE, CAPILLARY
Glucose-Capillary: 93 mg/dL (ref 65–99)
Glucose-Capillary: 80 mg/dL (ref 65–99)

## 2016-05-05 NOTE — Discharge Summary (Signed)
Physician Discharge Summary  Patient ID: Edward BottomRobert Quinn MRN: [id number] DOB/AGE: 51/05/1966 51 y.o.  Admit date: 05/03/2016 Discharge date: 05/05/2016  Admission Diagnoses:  Discharge Diagnoses:  Active Problems:   Diverticulitis of colon with perforation   Diverticulitis of large intestine with perforation without bleeding   Discharged Condition: good  Hospital Course: Pt presented with findings of mild sigmoid diverticultis. He was on oral Cipro/Flagyl for 3 days prior. He came back to hospital more foe nausea than abd pain. Since admission he has had no abdominal symptoms. Pain is much improved. He was treated with Zosyn IV.  He is being dischrged  Today with  Resumption of Cipro/Flagyl.  Consults: None  Significant Diagnostic Studies: radiology: CT scan: contained tiny perforation of sigmoid colon  Treatments: antibiotics: Zosyn  Discharge Exam: Blood pressure 140/79, pulse 57, temperature 98.2 F (36.8 C), temperature source Oral, resp. rate 20, height 5\' 6"  (1.676 m), weight 170 lb (77.111 kg), SpO2 98 %. GI: soft, non-tender; bowel sounds normal; no masses,  no organomegaly  Disposition: 01-Home or Self Care  Discharge Instructions    Activity as tolerated - No restrictions    Complete by:  As directed      Call MD for:  persistant nausea and vomiting    Complete by:  As directed      Call MD for:  severe uncontrolled pain    Complete by:  As directed      Call MD for:  temperature >100.4    Complete by:  As directed      Diet - low sodium heart healthy    Complete by:  As directed             Medication List    TAKE these medications        ciprofloxacin 500 MG tablet  Commonly known as:  CIPRO  Take 1 tablet (500 mg total) by mouth 2 (two) times daily.     HYDROcodone-acetaminophen 5-325 MG tablet  Commonly known as:  NORCO/VICODIN  Take 1 tablet by mouth every 6 (six) hours as needed for moderate pain.     lisinopril 10 MG tablet  Commonly known as:   PRINIVIL,ZESTRIL  Take 10 mg by mouth daily.     lovastatin 10 MG tablet  Commonly known as:  MEVACOR  Take 10 mg by mouth every morning.     metFORMIN 500 MG tablet  Commonly known as:  GLUCOPHAGE  Take 500 mg by mouth 2 (two) times daily.     metroNIDAZOLE 500 MG tablet  Commonly known as:  FLAGYL  Take 1 tablet (500 mg total) by mouth 2 (two) times daily.     ondansetron 4 MG tablet  Commonly known as:  ZOFRAN  Take 1 tablet (4 mg total) by mouth every 8 (eight) hours as needed for nausea or vomiting.           Follow-up Information    Follow up with [facility]. Schedule an appointment as soon as possible for a visit in 2 weeks.   Contact information:   [address] [place] [id number] [phone]      Signed: Kieth BrightlySANKAR,[name] 05/05/2016, 10:38 AM

## 2016-05-05 NOTE — Progress Notes (Signed)
Alert and oriented. Vital signs stable . No signs of acute distress. Discharge instructions given. Patient verbalizes understanding. No other issues noted at this time.   

## 2016-05-05 NOTE — Final Progress Note (Signed)
Pt with no complaints. Hungry. AVSS. Abdomen is soft, scant tenderness deep in llq. Active bowel sounds. Clinically much improved. Will discharge today. He has at least 5 days of Cipro/GFlagyl left at home.

## 2016-05-15 ENCOUNTER — Ambulatory Visit (INDEPENDENT_AMBULATORY_CARE_PROVIDER_SITE_OTHER): Payer: Managed Care, Other (non HMO) | Admitting: General Surgery

## 2016-05-15 ENCOUNTER — Encounter: Payer: Self-pay | Admitting: General Surgery

## 2016-05-15 ENCOUNTER — Other Ambulatory Visit: Payer: Self-pay

## 2016-05-15 VITALS — BP 138/92 | HR 64 | Temp 98.3°F | Ht 66.0 in | Wt 163.0 lb

## 2016-05-15 DIAGNOSIS — K5720 Diverticulitis of large intestine with perforation and abscess without bleeding: Secondary | ICD-10-CM

## 2016-05-15 NOTE — Progress Notes (Signed)
Outpatient Surgical Follow Up  05/15/2016  Britt BottomRobert Aaron is an 50 y.o. male.   Chief Complaint  Patient presents with  . Follow-up    Hospital Follow up: focal sigmoid diverticulosis    HPI: 50 year old male returns to clinic for follow-up from recent hospitalization for diverticulitis. Patient states that he finishes antibiotics earlier this week and has been pain free for at least the last week. He denies any fevers, chills, nausea, vomiting, chest pain, shortness of breath. He has been having some loose stools but no constipation. This is been his first ever attack and he's never had a colonoscopy before.  Past Medical History  Diagnosis Date  . Diabetes mellitus without complication (HCC)   . Hypertension   . Hyperlipidemia     Past Surgical History  Procedure Laterality Date  . Appendectomy      Family History  Problem Relation Age of Onset  . Epilepsy Mother   . Cancer Paternal Aunt     lung    Social History:  reports that he has never smoked. He does not have any smokeless tobacco history on file. He reports that he drinks alcohol. He reports that he does not use illicit drugs.  Allergies: No Known Allergies  Medications reviewed.    ROS  A multipoint review of systems was completed. All pertinent positives and negatives are documented within the history of present illness and remainder are negative.   BP 138/92 mmHg  Pulse 64  Temp(Src) 98.3 F (36.8 C) (Oral)  Ht 5\' 6"  (1.676 m)  Wt 73.936 kg (163 lb)  BMI 26.32 kg/m2  Physical Exam   Gen.: No acute distress Neck: Supple and nontender Lymph nodes: No evidence of cervical or clavicular lymphadenopathy Chest: Clear to auscultation Heart: Regular rhythm Abdomen: Soft, nontender, nondistended    No results found for this or any previous visit (from the past 48 hour(s)). No results found.  Assessment/Plan:  1. Diverticulitis of large intestine with perforation without bleeding  50 year old male  status post hospitalization for his first ever bout of diverticulitis. Discussed the signs and symptoms of recurrence as well as the treatment options for prevention of future bouts of diverticulitis in detail. Patient voiced understanding. He has travel plans for the summer already that he is unable to change, he understands that he needs a colonoscopy. He will plan for colonoscopy in about 3 months and will follow-up in clinic afterwards to discuss the results. He will also look clinic immediately should he have any recurrence of symptoms.     Ricarda Frameharles Earl Losee, MD FACS General Surgeon  05/15/2016,2:09 PM

## 2016-05-15 NOTE — Patient Instructions (Signed)
Diverticulitis °Diverticulitis is inflammation or infection of small pouches in your colon that form when you have a condition called diverticulosis. The pouches in your colon are called diverticula. Your colon, or large intestine, is where water is absorbed and stool is formed. °Complications of diverticulitis can include: °· Bleeding. °· Severe infection. °· Severe pain. °· Perforation of your colon. °· Obstruction of your colon. °CAUSES  °Diverticulitis is caused by bacteria. °Diverticulitis happens when stool becomes trapped in diverticula. This allows bacteria to grow in the diverticula, which can lead to inflammation and infection. °RISK FACTORS °People with diverticulosis are at risk for diverticulitis. Eating a diet that does not include enough fiber from fruits and vegetables may make diverticulitis more likely to develop. °SYMPTOMS  °Symptoms of diverticulitis may include: °· Abdominal pain and tenderness. The pain is normally located on the left side of the abdomen, but may occur in other areas. °· Fever and chills. °· Bloating. °· Cramping. °· Nausea. °· Vomiting. °· Constipation. °· Diarrhea. °· Blood in your stool. °DIAGNOSIS  °Your health care provider will ask you about your medical history and do a physical exam. You may need to have tests done because many medical conditions can cause the same symptoms as diverticulitis. Tests may include: °· Blood tests. °· Urine tests. °· Imaging tests of the abdomen, including X-rays and CT scans. °When your condition is under control, your health care provider may recommend that you have a colonoscopy. A colonoscopy can show how severe your diverticula are and whether something else is causing your symptoms. °TREATMENT  °Most cases of diverticulitis are mild and can be treated at home. Treatment may include: °· Taking over-the-counter pain medicines. °· Following a clear liquid diet. °· Taking antibiotic medicines by mouth for 7-10 days. °More severe cases may  be treated at a hospital. Treatment may include: °· Not eating or drinking. °· Taking prescription pain medicine. °· Receiving antibiotic medicines through an IV tube. °· Receiving fluids and nutrition through an IV tube. °· Surgery. °HOME CARE INSTRUCTIONS  °· Follow your health care provider's instructions carefully. °· Follow a full liquid diet or other diet as directed by your health care provider. After your symptoms improve, your health care provider may tell you to change your diet. He or she may recommend you eat a high-fiber diet. Fruits and vegetables are good sources of fiber. Fiber makes it easier to pass stool. °· Take fiber supplements or probiotics as directed by your health care provider. °· Only take medicines as directed by your health care provider. °· Keep all your follow-up appointments. °SEEK MEDICAL CARE IF:  °· Your pain does not improve. °· You have a hard time eating food. °· Your bowel movements do not return to normal. °SEEK IMMEDIATE MEDICAL CARE IF:  °· Your pain becomes worse. °· Your symptoms do not get better. °· Your symptoms suddenly get worse. °· You have a fever. °· You have repeated vomiting. °· You have bloody or black, tarry stools. °MAKE SURE YOU:  °· Understand these instructions. °· Will watch your condition. °· Will get help right away if you are not doing well or get worse. °  °This information is not intended to replace advice given to you by your health care provider. Make sure you discuss any questions you have with your health care provider. °  °Document Released: 08/27/2005 Document Revised: 11/22/2013 Document Reviewed: 10/12/2013 °Elsevier Interactive Patient Education ©2016 [employer] ° °

## 2016-05-18 ENCOUNTER — Emergency Department: Payer: Managed Care, Other (non HMO)

## 2016-05-18 ENCOUNTER — Encounter: Payer: Self-pay | Admitting: Emergency Medicine

## 2016-05-18 ENCOUNTER — Emergency Department
Admission: EM | Admit: 2016-05-18 | Discharge: 2016-05-18 | Disposition: A | Discharge status: Home / Self Care | Payer: Managed Care, Other (non HMO) | Attending: Emergency Medicine | Admitting: Emergency Medicine

## 2016-05-18 DIAGNOSIS — E785 Hyperlipidemia, unspecified: Secondary | ICD-10-CM | POA: Insufficient documentation

## 2016-05-18 DIAGNOSIS — K5732 Diverticulitis of large intestine without perforation or abscess without bleeding: Secondary | ICD-10-CM | POA: Diagnosis not present

## 2016-05-18 DIAGNOSIS — R1030 Lower abdominal pain, unspecified: Secondary | ICD-10-CM | POA: Diagnosis present

## 2016-05-18 DIAGNOSIS — Z79899 Other long term (current) drug therapy: Secondary | ICD-10-CM | POA: Diagnosis not present

## 2016-05-18 DIAGNOSIS — I10 Essential (primary) hypertension: Secondary | ICD-10-CM | POA: Diagnosis not present

## 2016-05-18 DIAGNOSIS — Z7984 Long term (current) use of oral hypoglycemic drugs: Secondary | ICD-10-CM | POA: Diagnosis not present

## 2016-05-18 DIAGNOSIS — E119 Type 2 diabetes mellitus without complications: Secondary | ICD-10-CM | POA: Insufficient documentation

## 2016-05-18 HISTORY — DX: Diverticulitis of intestine, part unspecified, without perforation or abscess without bleeding: K57.92

## 2016-05-18 LAB — COMPREHENSIVE METABOLIC PANEL
ALT: 18 U/L (ref 17–63)
AST: 24 U/L (ref 15–41)
Albumin: 4.2 g/dL (ref 3.5–5.0)
Alkaline Phosphatase: 61 U/L (ref 38–126)
Anion gap: 9 (ref 5–15)
BUN: 11 mg/dL (ref 6–20)
CO2: 24 mmol/L (ref 22–32)
Calcium: 9 mg/dL (ref 8.9–10.3)
Chloride: 105 mmol/L (ref 101–111)
Creatinine, Ser: 0.96 mg/dL (ref 0.61–1.24)
GFR calc Af Amer: >60 mL/min (ref >60)
GFR calc non Af Amer: >60 mL/min (ref >60)
Glucose, Bld: 134 mg/dL — ABNORMAL HIGH (ref 65–99)
Potassium: 3.9 mmol/L (ref 3.5–5.1)
Sodium: 138 mmol/L (ref 135–145)
Total Bilirubin: 1.3 mg/dL — ABNORMAL HIGH (ref 0.3–1.2)
Total Protein: 7.4 g/dL (ref 6.5–8.1)

## 2016-05-18 LAB — CBC WITH DIFFERENTIAL/PLATELET
Basophils Absolute: 0.1 10*3/uL (ref 0–0.1)
Basophils Relative: 0 %
Eosinophils Absolute: 0 10*3/uL (ref 0–0.7)
Eosinophils Relative: 0 %
HCT: 44.4 % (ref 40.0–52.0)
Hemoglobin: 15.1 g/dL (ref 13.0–18.0)
Lymphocytes Relative: 4 %
Lymphs Abs: 0.5 10*3/uL — ABNORMAL LOW (ref 1.0–3.6)
MCH: 29.2 pg (ref 26.0–34.0)
MCHC: 34 g/dL (ref 32.0–36.0)
MCV: 85.9 fL (ref 80.0–100.0)
Monocytes Absolute: 0.6 10*3/uL (ref 0.2–1.0)
Monocytes Relative: 5 %
Neutro Abs: 12.7 10*3/uL — ABNORMAL HIGH (ref 1.4–6.5)
Neutrophils Relative %: 91 %
Platelets: 209 10*3/uL (ref 150–440)
RBC: 5.17 MIL/uL (ref 4.40–5.90)
RDW: 12.9 % (ref 11.5–14.5)
WBC: 14 10*3/uL — ABNORMAL HIGH (ref 3.8–10.6)

## 2016-05-18 LAB — URINALYSIS COMPLETE WITH MICROSCOPIC (ARMC ONLY)
Bacteria, UA: NONE SEEN
Bilirubin Urine: NEGATIVE
Glucose, UA: NEGATIVE mg/dL
Ketones, ur: NEGATIVE mg/dL
Leukocytes, UA: NEGATIVE
Nitrite: NEGATIVE
Protein, ur: NEGATIVE mg/dL
Specific Gravity, Urine: 1.02 (ref 1.005–1.030)
Squamous Epithelial / LPF: NONE SEEN
pH: 5 (ref 5.0–8.0)

## 2016-05-18 MED ORDER — ONDANSETRON HCL 4 MG/2ML IJ SOLN
4.0000 mg | Freq: Once | INTRAMUSCULAR | Status: AC
  Administered 2016-05-18: 4 mg via INTRAVENOUS
  Filled 2016-05-18: qty 2

## 2016-05-18 MED ORDER — METRONIDAZOLE 500 MG PO TABS: 500.0000 mg | ORAL_TABLET | Freq: Three times a day (TID) | ORAL | Status: AC

## 2016-05-18 MED ORDER — PROMETHAZINE HCL 12.5 MG PO TABS: 12.5000 mg | ORAL_TABLET | Freq: Four times a day (QID) | ORAL | Status: DC | PRN

## 2016-05-18 MED ORDER — KETOROLAC TROMETHAMINE 30 MG/ML IJ SOLN
30.0000 mg | Freq: Once | INTRAMUSCULAR | Status: AC
  Administered 2016-05-18: 30 mg via INTRAVENOUS
  Filled 2016-05-18: qty 1

## 2016-05-18 MED ORDER — CIPROFLOXACIN HCL 500 MG PO TABS: 500.0000 mg | ORAL_TABLET | Freq: Two times a day (BID) | ORAL | Status: AC

## 2016-05-18 MED ORDER — DIATRIZOATE MEGLUMINE & SODIUM 66-10 % PO SOLN
15.0000 mL | Freq: Once | ORAL | Status: AC
  Administered 2016-05-18: 15 mL via ORAL

## 2016-05-18 MED ORDER — SODIUM CHLORIDE 0.9 % IV BOLUS (SEPSIS)
500.0000 mL | Freq: Once | INTRAVENOUS | Status: AC
  Administered 2016-05-18: 500 mL via INTRAVENOUS

## 2016-05-18 MED ORDER — IOPAMIDOL (ISOVUE-300) INJECTION 61%
100.0000 mL | Freq: Once | INTRAVENOUS | Status: AC | PRN
  Administered 2016-05-18: 100 mL via INTRAVENOUS

## 2016-05-18 NOTE — ED Notes (Signed)
Discussed discharge instructions, prescriptions, and follow-up care with patient. No questions or concerns at this time. Pt stable at discharge.  

## 2016-05-18 NOTE — Discharge Instructions (Signed)
Diverticulitis Diverticulitis is inflammation or infection of small pouches in your colon that form when you have a condition called diverticulosis. The pouches in your colon are called diverticula. Your colon, or large intestine, is where water is absorbed and stool is formed. Complications of diverticulitis can include:  Bleeding.  Severe infection.  Severe pain.  Perforation of your colon.  Obstruction of your colon. CAUSES  Diverticulitis is caused by bacteria. Diverticulitis happens when stool becomes trapped in diverticula. This allows bacteria to grow in the diverticula, which can lead to inflammation and infection. RISK FACTORS People with diverticulosis are at risk for diverticulitis. Eating a diet that does not include enough fiber from fruits and vegetables may make diverticulitis more likely to develop. SYMPTOMS  Symptoms of diverticulitis may include:  Abdominal pain and tenderness. The pain is normally located on the left side of the abdomen, but may occur in other areas.  Fever and chills.  Bloating.  Cramping.  Nausea.  Vomiting.  Constipation.  Diarrhea.  Blood in your stool. DIAGNOSIS  Your health care provider will ask you about your medical history and do a physical exam. You may need to have tests done because many medical conditions can cause the same symptoms as diverticulitis. Tests may include:  Blood tests.  Urine tests.  Imaging tests of the abdomen, including X-rays and CT scans. When your condition is under control, your health care provider may recommend that you have a colonoscopy. A colonoscopy can show how severe your diverticula are and whether something else is causing your symptoms. TREATMENT  Most cases of diverticulitis are mild and can be treated at home. Treatment may include:  Taking over-the-counter pain medicines.  Following a clear liquid diet.  Taking antibiotic medicines by mouth for 7-10 days. More severe cases may  be treated at a hospital. Treatment may include:  Not eating or drinking.  Taking prescription pain medicine.  Receiving antibiotic medicines through an IV tube.  Receiving fluids and nutrition through an IV tube.  Surgery. HOME CARE INSTRUCTIONS   Follow your health care provider's instructions carefully.  Follow a full liquid diet or other diet as directed by your health care provider. After your symptoms improve, your health care provider may tell you to change your diet. He or she may recommend you eat a high-fiber diet. Fruits and vegetables are good sources of fiber. Fiber makes it easier to pass stool.  Take fiber supplements or probiotics as directed by your health care provider.  Only take medicines as directed by your health care provider.  Keep all your follow-up appointments. SEEK MEDICAL CARE IF:   Your pain does not improve.  You have a hard time eating food.  Your bowel movements do not return to normal. SEEK IMMEDIATE MEDICAL CARE IF:   Your pain becomes worse.  Your symptoms do not get better.  Your symptoms suddenly get worse.  You have a fever.  You have repeated vomiting.  You have bloody or black, tarry stools. MAKE SURE YOU:   Understand these instructions.  Will watch your condition.  Will get help right away if you are not doing well or get worse.   This information is not intended to replace advice given to you by your health care provider. Make sure you discuss any questions you have with your health care provider.   Document Released: 08/27/2005 Document Revised: 11/22/2013 Document Reviewed: 10/12/2013 Elsevier Interactive Patient Education [employer].  Please return immediately if condition worsens. Please contact  her primary physician or the physician you were given for referral. If you have any specialist physicians involved in her treatment and plan please also contact them. Thank you for using  regional emergency  Department.    Liquid diet for at least 24 hours. Over-the-counter Tylenol and/or ibuprofen for pain. Return to emergency department especially for increased pain, fever, bloody stool, or any other new concerns

## 2016-05-18 NOTE — ED Provider Notes (Signed)
Time Seen: Approximately 0 740  I have reviewed the triage notes  Chief Complaint: Abdominal Pain   History of Present Illness: Edward Quinn is a 50 y.o. male who's had a recent course of diverticulitis with a small bowel perforation. He did not require surgical management and is finished his antibiotics etc. Patient didn't follow-up with the general surgeon and has an outpatient colonoscopy scheduled. Patient states he ate some corn chips last night and states after dinner he started having some increased pain over the lower middle abdominal region consistent with his diverticulitis. At home. He states he does have some bowel urgency but no blood in his stool.  Past Medical History  Diagnosis Date  . Diabetes mellitus without complication (HCC)   . Hypertension   . Hyperlipidemia   . Diverticulitis     Patient Active Problem List   Diagnosis Date Noted  . Diverticulitis of large intestine with perforation without bleeding   . Diverticulitis of colon with perforation 05/03/2016    Past Surgical History  Procedure Laterality Date  . Appendectomy      Past Surgical History  Procedure Laterality Date  . Appendectomy      Current Outpatient Rx  Name  Route  Sig  Dispense  Refill  . lisinopril (PRINIVIL,ZESTRIL) 10 MG tablet   Oral   Take 10 mg by mouth daily.         Marland Kitchen. lovastatin (MEVACOR) 10 MG tablet   Oral   Take 10 mg by mouth every morning.          . metFORMIN (GLUCOPHAGE) 500 MG tablet   Oral   Take 500 mg by mouth 2 (two) times daily.           Allergies:  Review of patient's allergies indicates no known allergies.  Family History: Family History  Problem Relation Age of Onset  . Epilepsy Mother   . Cancer Paternal Aunt     lung    Social History: Social History  Substance Use Topics  . Smoking status: Never Smoker   . Smokeless tobacco: None  . Alcohol Use: 0.0 oz/week    0 Standard drinks or equivalent per week     Comment: ocass      Review of Systems:   10 point review of systems was performed and was otherwise negative:  Constitutional: No fever Eyes: No visual disturbances ENT: No sore throat, ear pain Cardiac: No chest pain Respiratory: No shortness of breath, wheezing, or stridor Abdomen: Pains primarily about the lateral lower abdominal region with some radiation into the left upper quadrant. Endocrine: No weight loss, No night sweats Extremities: No peripheral edema, cyanosis Skin: No rashes, easy bruising Neurologic: No focal weakness, trouble with speech or swollowing Urologic: No dysuria, Hematuria, or urinary frequency  Physical Exam:  ED Triage Vitals  Enc Vitals Group     BP 05/18/16 0659 126/81 mmHg     Pulse Rate 05/18/16 0659 95     Resp 05/18/16 0659 18     Temp 05/18/16 0659 97.8 F (36.6 C)     Temp Source 05/18/16 0659 Oral     SpO2 05/18/16 0659 98 %     Weight 05/18/16 0659 162 lb (73.483 kg)     Height 05/18/16 0659 5\' 6"  (1.676 m)     Head Cir --      Peak Flow --      Pain Score 05/18/16 0700 7     Pain Loc --  Pain Edu? --      Excl. in GC? --     General: Awake , Alert , and Oriented times 3; GCS 15 Head: Normal cephalic , atraumatic Eyes: Pupils equal , round, reactive to light Nose/Throat: No nasal drainage, patent upper airway without erythema or exudate.  Neck: Supple, Full range of motion, No anterior adenopathy or palpable thyroid masses Lungs: Clear to ascultation without wheezes , rhonchi, or rales Heart: Regular rate, regular rhythm without murmurs , gallops , or rubs Abdomen: Mild tenderness to deep palpation over the lower middle abdominal region without peritoneal signs. Bowel sounds are positive and symmetric in all 4 quadrants. There is no focal tenderness over the epigastric or right upper quadrant        Extremities: 2 plus symmetric pulses. No edema, clubbing or cyanosis Neurologic: normal ambulation, Motor symmetric without deficits, sensory  intact Skin: warm, dry, no rashes   Labs:   All laboratory work was reviewed including any pertinent negatives or positives listed below:  Labs Reviewed  URINALYSIS COMPLETEWITH MICROSCOPIC ([facility] ONLY) - Abnormal; Notable for the following:    Color, Urine YELLOW (*)    APPearance CLEAR (*)    Hgb urine dipstick 1+ (*)    All other components within normal limits  CBC WITH DIFFERENTIAL/PLATELET  COMPREHENSIVE METABOLIC PANEL  White blood cell count is elevated otherwise lab work is all within normal limits   Radiology: *   CT ABDOMEN PELVIS W CONTRAST (Final result) Result time: 05/18/16 10:12:34   Final result by Rad Results In Interface (05/18/16 10:12:34)   Narrative:   CLINICAL DATA: Acute lower abdominal pain, diarrhea.  EXAM: CT ABDOMEN AND PELVIS WITH CONTRAST  TECHNIQUE: Multidetector CT imaging of the abdomen and pelvis was performed using the standard protocol following bolus administration of intravenous contrast.  CONTRAST: ISOVUE-300 IOPAMIDOL (ISOVUE-300) INJECTION 61%  COMPARISON: CT scan of May 03, 2016.  FINDINGS: Visualized lung bases are unremarkable. No significant osseous abnormality is noted.  No gallstones are noted. The liver, spleen and pancreas are unremarkable. Adrenal glands and kidneys appear normal. No hydronephrosis or renal obstruction is noted. No renal or ureteral calculi are noted. Atherosclerosis of abdominal aorta is noted without aneurysm formation. There is no evidence of bowel obstruction. No abnormal fluid collection is noted. Urinary bladder appears normal. No significant adenopathy is noted. Focal sigmoid diverticulitis remains without evidence of abscess.  IMPRESSION: Continued presence of focal sigmoid diverticulitis without evidence of abscess.   Electronically Signed By: Lupita Raider, M.D. On: 05/18/2016 10:12       I personally reviewed the radiologic studies   ED Course:  Patient's  stay here was uneventful and he had symptomatic improvement with his pain with IV Toradol. Patient appears to have a exacerbation of his diverticulitis. Fortunately, on this visit he does not appear to have any signs of perforation or abscess. Patient admits to eating and an appropriate diet last evening and I felt we could attempt outpatient treatment with oral antibiotics. He was advised to have a low threshold for returning to the emergency department especially if he develops a fever persistent vomiting, increased pain, bloody stool, or any other new concerns. Patient was advised to drink plenty of fluids for the next 24-36 hours primarily liquid diet and then start advancing his diet as tolerated with easily digestible foods. He is aware of the diet and agrees to comply.  Assessment: Acute exacerbation of diverticulitis      Plan:  Outpatient management  Phenergan, Cipro, Flagyl prescriptions Patient was advised to return immediately if condition worsens. Patient was advised to follow up with their primary care physician or other specialized physicians involved in their outpatient care. The patient and/or family member/power of attorney had laboratory results reviewed at the bedside. All questions and concerns were addressed and appropriate discharge instructions were distributed by the nursing staff.             Jennye Moccasin, MD 05/18/16 1047

## 2016-05-18 NOTE — ED Notes (Signed)
Patient states that he developed lower abd pain and diarrhea last night. Patient states that he recently had diverticulitis and that the pain feels the same.

## 2016-08-12 ENCOUNTER — Other Ambulatory Visit: Payer: Self-pay

## 2016-09-10 NOTE — Discharge Instructions (Signed)

## 2016-09-12 ENCOUNTER — Encounter
Admission: RE | Disposition: A | Discharge status: Home / Self Care | Payer: Self-pay | Source: Ambulatory Visit | Attending: Gastroenterology

## 2016-09-12 ENCOUNTER — Ambulatory Visit
Admission: RE | Admit: 2016-09-12 | Discharge: 2016-09-12 | Disposition: A | Discharge status: Home / Self Care | Payer: Managed Care, Other (non HMO) | Source: Ambulatory Visit | Attending: Gastroenterology | Admitting: Gastroenterology

## 2016-09-12 ENCOUNTER — Ambulatory Visit: Payer: Managed Care, Other (non HMO) | Admitting: Student in an Organized Health Care Education/Training Program

## 2016-09-12 DIAGNOSIS — K641 Second degree hemorrhoids: Secondary | ICD-10-CM | POA: Diagnosis not present

## 2016-09-12 DIAGNOSIS — Z79899 Other long term (current) drug therapy: Secondary | ICD-10-CM | POA: Diagnosis not present

## 2016-09-12 DIAGNOSIS — K5730 Diverticulosis of large intestine without perforation or abscess without bleeding: Secondary | ICD-10-CM

## 2016-09-12 DIAGNOSIS — K219 Gastro-esophageal reflux disease without esophagitis: Secondary | ICD-10-CM | POA: Diagnosis not present

## 2016-09-12 DIAGNOSIS — Z7984 Long term (current) use of oral hypoglycemic drugs: Secondary | ICD-10-CM | POA: Diagnosis not present

## 2016-09-12 DIAGNOSIS — I10 Essential (primary) hypertension: Secondary | ICD-10-CM | POA: Insufficient documentation

## 2016-09-12 DIAGNOSIS — K5732 Diverticulitis of large intestine without perforation or abscess without bleeding: Secondary | ICD-10-CM | POA: Diagnosis not present

## 2016-09-12 DIAGNOSIS — E785 Hyperlipidemia, unspecified: Secondary | ICD-10-CM | POA: Diagnosis not present

## 2016-09-12 DIAGNOSIS — E119 Type 2 diabetes mellitus without complications: Secondary | ICD-10-CM | POA: Diagnosis not present

## 2016-09-12 DIAGNOSIS — M1990 Unspecified osteoarthritis, unspecified site: Secondary | ICD-10-CM | POA: Insufficient documentation

## 2016-09-12 HISTORY — DX: Gastro-esophageal reflux disease without esophagitis: K21.9

## 2016-09-12 HISTORY — DX: Unspecified osteoarthritis, unspecified site: M19.90

## 2016-09-12 HISTORY — PX: COLONOSCOPY WITH PROPOFOL: SHX5780

## 2016-09-12 LAB — GLUCOSE, CAPILLARY
Glucose-Capillary: 120 mg/dL — ABNORMAL HIGH (ref 65–99)
Glucose-Capillary: 114 mg/dL — ABNORMAL HIGH (ref 65–99)

## 2016-09-12 SURGERY — COLONOSCOPY WITH PROPOFOL
Anesthesia: Monitor Anesthesia Care | Wound class: Contaminated

## 2016-09-12 MED ORDER — ACETAMINOPHEN 160 MG/5ML PO SOLN: 325.0000 mg | ORAL | Status: DC | PRN

## 2016-09-12 MED ORDER — DEXAMETHASONE SODIUM PHOSPHATE 4 MG/ML IJ SOLN: 8.0000 mg | Freq: Once | INTRAMUSCULAR | Status: DC | PRN

## 2016-09-12 MED ORDER — ACETAMINOPHEN 325 MG PO TABS: 325.0000 mg | ORAL_TABLET | ORAL | Status: DC | PRN

## 2016-09-12 MED ORDER — OXYCODONE HCL 5 MG PO TABS: 5.0000 mg | ORAL_TABLET | Freq: Once | ORAL | Status: DC | PRN

## 2016-09-12 MED ORDER — LACTATED RINGERS IV SOLN: 500.0000 mL | INTRAVENOUS | Status: DC

## 2016-09-12 MED ORDER — FENTANYL CITRATE (PF) 100 MCG/2ML IJ SOLN: 25.0000 ug | INTRAMUSCULAR | Status: DC | PRN

## 2016-09-12 MED ORDER — LACTATED RINGERS IV SOLN
INTRAVENOUS | Status: DC
  Administered 2016-09-12: 08:00:00 via INTRAVENOUS

## 2016-09-12 MED ORDER — LACTATED RINGERS IV SOLN
INTRAVENOUS | Status: DC
  Administered 2016-09-12: 07:00:00 via INTRAVENOUS

## 2016-09-12 MED ORDER — PROPOFOL 10 MG/ML IV BOLUS
INTRAVENOUS | Status: DC | PRN
  Administered 2016-09-12 (×3): 50 mg via INTRAVENOUS
  Administered 2016-09-12: 20 mg via INTRAVENOUS
  Administered 2016-09-12 (×3): 50 mg via INTRAVENOUS

## 2016-09-12 MED ORDER — LIDOCAINE HCL (CARDIAC) 20 MG/ML IV SOLN
INTRAVENOUS | Status: DC | PRN
  Administered 2016-09-12: 50 mg via INTRAVENOUS

## 2016-09-12 MED ORDER — OXYCODONE HCL 5 MG/5ML PO SOLN: 5.0000 mg | Freq: Once | ORAL | Status: DC | PRN

## 2016-09-12 MED ORDER — STERILE WATER FOR IRRIGATION IR SOLN
Status: DC | PRN
  Administered 2016-09-12: 08:00:00

## 2016-09-12 SURGICAL SUPPLY — 23 items

## 2016-09-12 NOTE — Anesthesia Preprocedure Evaluation (Signed)
Anesthesia Evaluation  Patient identified by MRN, date of birth, ID band Patient awake    Reviewed: Allergy & Precautions, H&P , NPO status , Patient's Chart, lab work & pertinent test results, reviewed documented beta blocker date and time   Airway Mallampati: II  TM Distance: >3 FB Neck ROM: full    Dental no notable dental hx.    Pulmonary neg pulmonary ROS,    Pulmonary exam normal breath sounds clear to auscultation       Cardiovascular Exercise Tolerance: Good hypertension, On Medications  Rhythm:regular Rate:Normal     Neuro/Psych negative neurological ROS  negative psych ROS   GI/Hepatic Neg liver ROS, GERD  Medicated,  Endo/Other  diabetes, Well Controlled  Renal/GU negative Renal ROS  negative genitourinary   Musculoskeletal   Abdominal   Peds  Hematology negative hematology ROS (+)   Anesthesia Other Findings   Reproductive/Obstetrics negative OB ROS                             Anesthesia Physical Anesthesia Plan  ASA: III  Anesthesia Plan: MAC   Post-op Pain Management:    Induction:   Airway Management Planned:   Additional Equipment:   Intra-op Plan:   Post-operative Plan:   Informed Consent: I have reviewed the patients History and Physical, chart, labs and discussed the procedure including the risks, benefits and alternatives for the proposed anesthesia with the patient or authorized representative who has indicated his/her understanding and acceptance.     Plan Discussed with: CRNA  Anesthesia Plan Comments:         Anesthesia Quick Evaluation

## 2016-09-12 NOTE — Op Note (Signed)
[facility] Gastroenterology Patient Name: Edward Quinn Procedure Date: 09/12/2016 8:03 AM MRN: [id number] Account #: [id number] Date of Birth: [birth year] Admit Type: Outpatient Age: 50 Room: [facility] OR ROOM 01 Gender: Male Note Status: Finalized Procedure:            Colonoscopy Indications:          Follow-up of diverticulitis Providers:            Midge Minium MD, MD Referring MD:         Kris Mouton. Gerilyn Pilgrim (Referring MD) Medicines:            Propofol per Anesthesia Complications:        No immediate complications. Procedure:            Pre-Anesthesia Assessment:                       - Prior to the procedure, a History and Physical was                        performed, and patient medications and allergies were                        reviewed. The patient's tolerance of previous                        anesthesia was also reviewed. The risks and benefits of                        the procedure and the sedation options and risks were                        discussed with the patient. All questions were                        answered, and informed consent was obtained. Prior                        Anticoagulants: The patient has taken no previous                        anticoagulant or antiplatelet agents. ASA Grade                        Assessment: II - A patient with mild systemic disease.                        After reviewing the risks and benefits, the patient was                        deemed in satisfactory condition to undergo the                        procedure.                       After obtaining informed consent, the colonoscope was                        passed under direct vision. Throughout the procedure,  the patient's blood pressure, pulse, and oxygen                        saturations were monitored continuously. The was                        introduced through the anus and advanced to the the                        cecum,  identified by appendiceal orifice and ileocecal                        valve. The colonoscopy was performed without                        difficulty. The patient tolerated the procedure well.                        The quality of the bowel preparation was excellent. Findings:      The perianal and digital rectal examinations were normal.      A few small-mouthed diverticula were found in the sigmoid colon and       cecum.      Non-bleeding internal hemorrhoids were found during retroflexion. The       hemorrhoids were Grade II (internal hemorrhoids that prolapse but reduce       spontaneously). Impression:           - Diverticulosis in the sigmoid colon and in the cecum.                       - Non-bleeding internal hemorrhoids.                       - No specimens collected. Recommendation:       - Discharge patient to home.                       - Resume previous diet.                       - Continue present medications.                       - Repeat colonoscopy in 10 years for screening unless                        any change in family history or lower GI problems. Procedure Code(s):    --- Professional ---                       [phone], Colonoscopy, flexible; diagnostic, including                        collection of specimen(s) by brushing or washing, when                        performed (separate procedure) Diagnosis Code(s):    --- Professional ---                       U04.54, Diverticulitis of large intestine without  perforation or abscess without bleeding                       K57.30, Diverticulosis of large intestine without                        perforation or abscess without bleeding CPT copyright 2016 American Medical Association. All rights reserved. The codes documented in this report are preliminary and upon coder review may  be revised to meet current compliance requirements. Midge Miniumarren Kinza Gouveia MD, MD 09/12/2016 8:31:43 AM This report has been  signed electronically. Number of Addenda: 0 Note Initiated On: 09/12/2016 8:03 AM Scope Withdrawal Time: 0 hours 7 minutes 18 seconds  Total Procedure Duration: 0 hours 10 minutes 35 seconds       [facility]

## 2016-09-12 NOTE — Transfer of Care (Signed)
Immediate Anesthesia Transfer of Care Note  Patient: Edward Quinn  Procedure(s) Performed: Procedure(s) with comments: COLONOSCOPY WITH PROPOFOL (N/A) - Diabetic  Patient Location: PACU  Anesthesia Type: MAC  Level of Consciousness: awake, alert  and patient cooperative  Airway and Oxygen Therapy: Patient Spontanous Breathing and Patient connected to supplemental oxygen  Post-op Assessment: Post-op Vital signs reviewed, Patient's Cardiovascular Status Stable, Respiratory Function Stable, Patent Airway and No signs of Nausea or vomiting  Post-op Vital Signs: Reviewed and stable  Complications: No apparent anesthesia complications

## 2016-09-12 NOTE — Anesthesia Postprocedure Evaluation (Signed)
Anesthesia Post Note  Patient: Edward BottomRobert Quinn  Procedure(s) Performed: Procedure(s) (LRB): COLONOSCOPY WITH PROPOFOL (N/A)  Patient location during evaluation: PACU Anesthesia Type: MAC Level of consciousness: awake and alert Pain management: pain level controlled Vital Signs Assessment: post-procedure vital signs reviewed and stable Respiratory status: spontaneous breathing, nonlabored ventilation and respiratory function stable Cardiovascular status: stable and blood pressure returned to baseline Anesthetic complications: no    Vista Sawatzky D Cassiel Fernandez

## 2016-09-12 NOTE — H&P (Signed)
  Edward Miniumarren Daylen Lipsky, MD [facility] [address] [place], [place] [address] Phone: [phone] Fax : [phone]  Primary Care Physician:  Thornton PapasSykes, Larry Justain, PA-C Primary Gastroenterologist:  Dr. Servando SnareWohl  Pre-Procedure History & Physical: HPI:  Edward Quinn is a 50 y.o. male is here for an colonoscopy.   Past Medical History:  Diagnosis Date  . Arthritis    hands  . Diabetes mellitus without complication (HCC)   . Diverticulitis   . GERD (gastroesophageal reflux disease)   . Hyperlipidemia   . Hypertension     Past Surgical History:  Procedure Laterality Date  . ACHILLES TENDON REPAIR    . APPENDECTOMY      Prior to Admission medications   Medication Sig Start Date End Date Taking? Authorizing Provider  lisinopril (PRINIVIL,ZESTRIL) 10 MG tablet Take 10 mg by mouth daily. 04/12/16  Yes Historical Provider, MD  lovastatin (MEVACOR) 10 MG tablet Take 10 mg by mouth every morning.  04/12/16  Yes Historical Provider, MD  metFORMIN (GLUCOPHAGE) 500 MG tablet Take 500 mg by mouth 2 (two) times daily. 04/12/16  Yes Historical Provider, MD  promethazine (PHENERGAN) 12.5 MG tablet Take 1 tablet (12.5 mg total) by mouth every 6 (six) hours as needed for nausea or vomiting. Patient not taking: Reported on 09/05/2016 05/18/16   Jennye MoccasinBrian S Quigley, MD    Allergies as of 05/15/2016  . (No Known Allergies)    Family History  Problem Relation Age of Onset  . Epilepsy Mother   . Cancer Paternal Aunt     lung    Social History   Social History  . Marital status: Single    Spouse name: N/A  . Number of children: N/A  . Years of education: N/A   Occupational History  . Not on file.   Social History Main Topics  . Smoking status: Never Smoker  . Smokeless tobacco: Never Used  . Alcohol use 4.2 oz/week    7 Cans of beer per week     Comment: ocass  . Drug use: No  . Sexual activity: Not on file   Other Topics Concern  . Not on file   Social History Narrative  . No narrative on  file    Review of Systems: See HPI, otherwise negative ROS  Physical Exam: BP (!) 158/97   Pulse 77   Temp 97.6 F (36.4 C) (Temporal)   Resp 16   Ht 5\' 6"  (1.676 m)   Wt 156 lb (70.8 kg)   SpO2 100%   BMI 25.18 kg/m  General:   Alert,  pleasant and cooperative in NAD Head:  Normocephalic and atraumatic. Neck:  Supple; no masses or thyromegaly. Lungs:  Clear throughout to auscultation.    Heart:  Regular rate and rhythm. Abdomen:  Soft, nontender and nondistended. Normal bowel sounds, without guarding, and without rebound.   Neurologic:  Alert and  oriented x4;  grossly normal neurologically.  Impression/Plan: Edward Quinn is here for an colonoscopy to be performed for recent diverticulitis  Risks, benefits, limitations, and alternatives regarding  colonoscopy have been reviewed with the patient.  Questions have been answered.  All parties agreeable.   Edward Miniumarren Galen Russman, MD  09/12/2016, 8:08 AM

## 2016-09-15 ENCOUNTER — Encounter: Payer: Self-pay | Admitting: Gastroenterology

## 2017-05-05 IMAGING — CT CT ABD-PELV W/ CM
2 of 5 series · 16 of 46 positions shown, 18 images · IV contrast (iopamidol)
Comparison: 04/29/2016

CLINICAL DATA: 49-year-old male with continued abdominal/pelvic
pain and increasing nausea following diagnosis of sigmoid
diverticulitis on 04/29/2016.

EXAM:
CT ABDOMEN AND PELVIS WITH CONTRAST
TECHNIQUE: Multidetector CT imaging of the abdomen and pelvis was performed
using the standard protocol following bolus administration of
intravenous contrast.
CONTRAST:  100mL [id number] IOPAMIDOL (JL8OSZ-K88) INJECTION 61%

[Series 2: routine abd pel with · axial · 0.72mm/px · z∈[-1013,-623]mm · 13 of 88 slices shown, 15 images]
[im 5/88  soft-tissue]
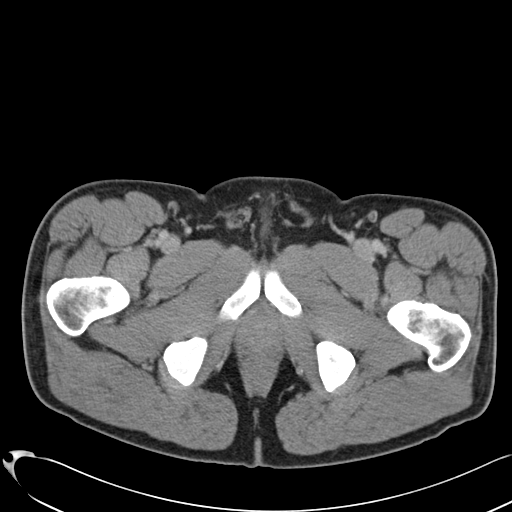
[im 5/88  bone]
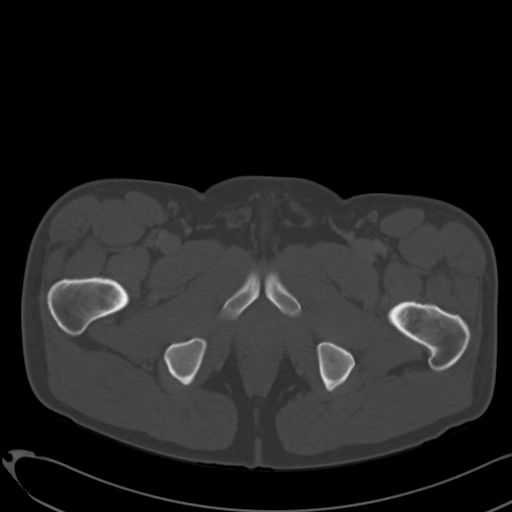
[im 10/88  soft-tissue]
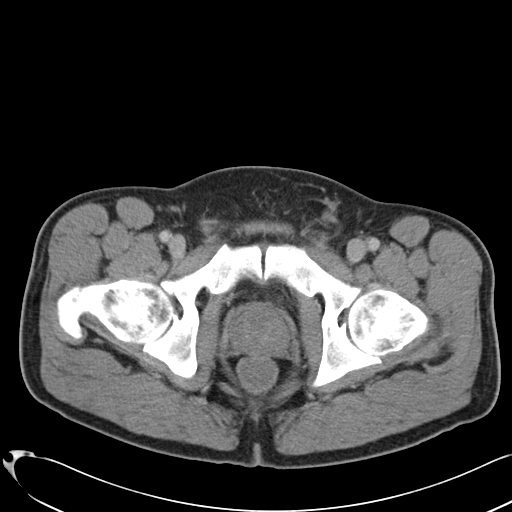
[im 20/88  soft-tissue]
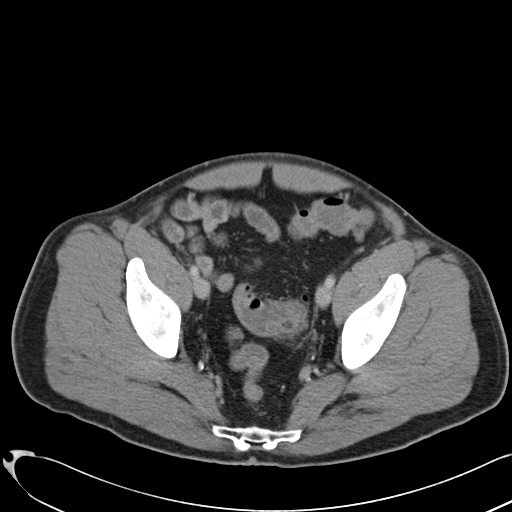
[im 25/88  soft-tissue]
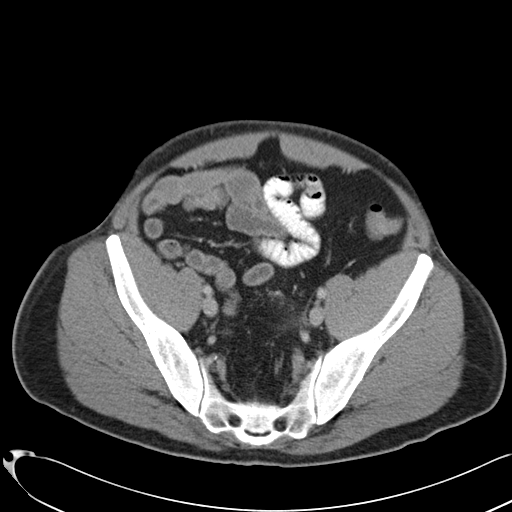
[im 30/88  soft-tissue]
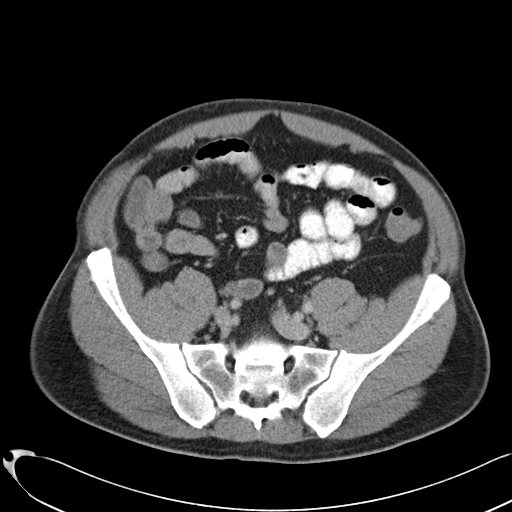
[im 39/88  soft-tissue]
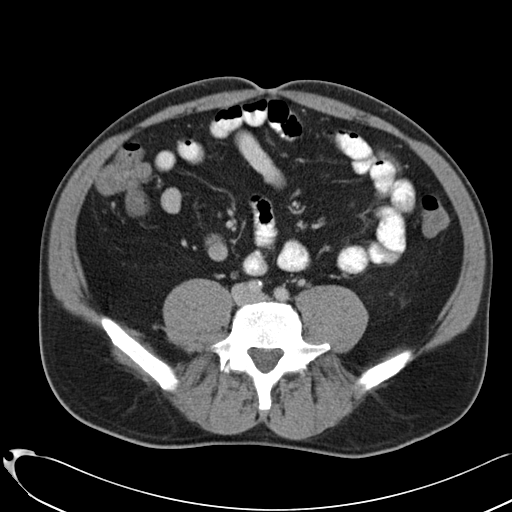
[im 44/88  soft-tissue]
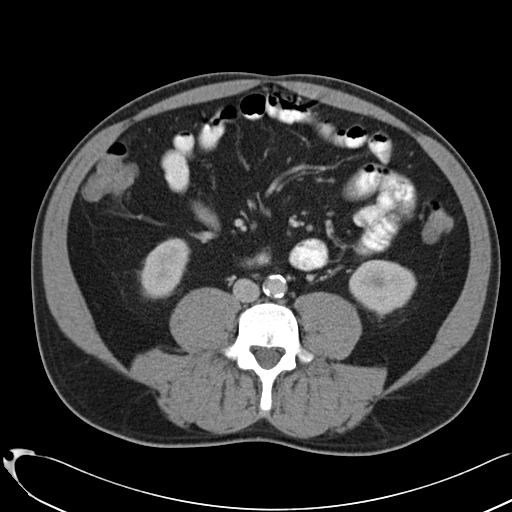
[im 49/88  soft-tissue]
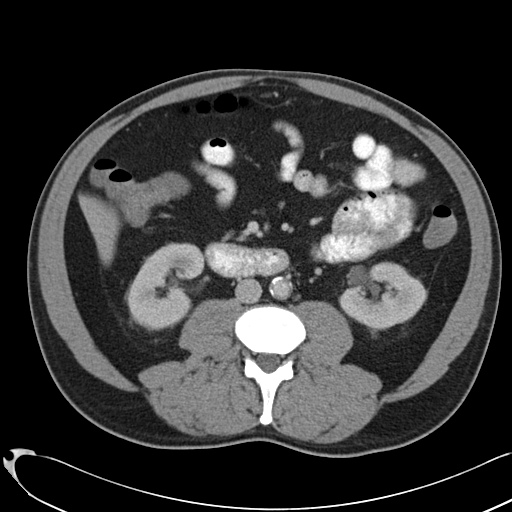
[im 59/88  soft-tissue]
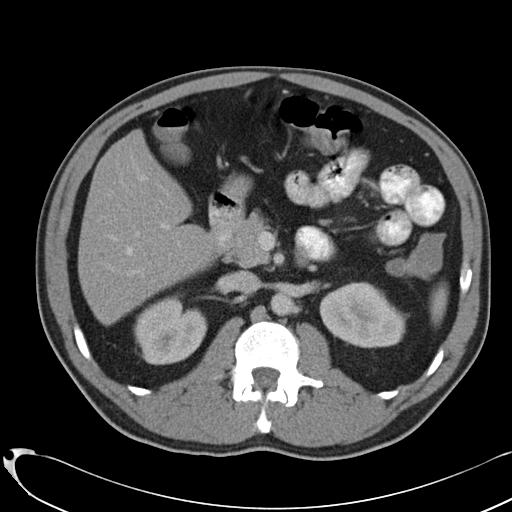
[im 59/88  bone]
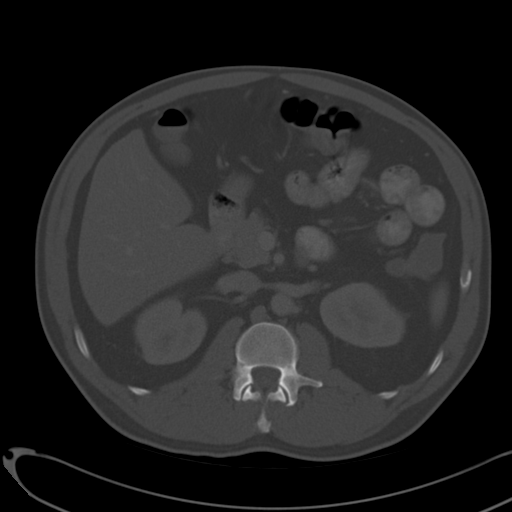
[im 63/88  soft-tissue]
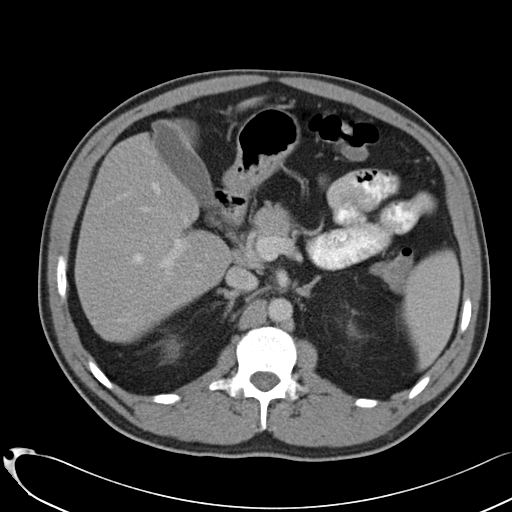
[im 68/88  soft-tissue]
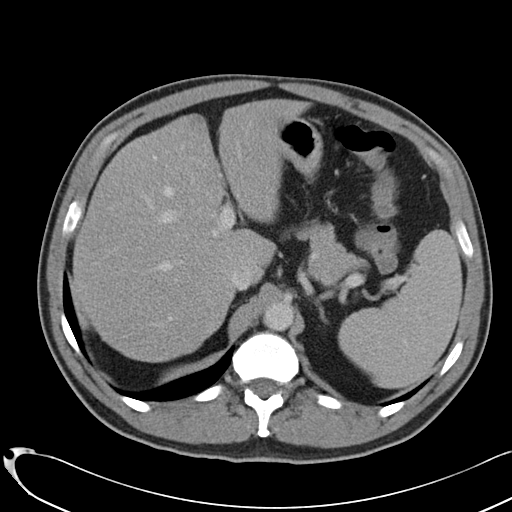
[im 78/88  soft-tissue]
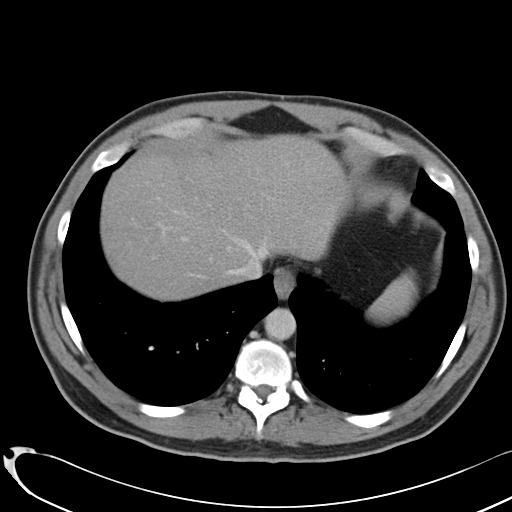
[im 83/88  soft-tissue]
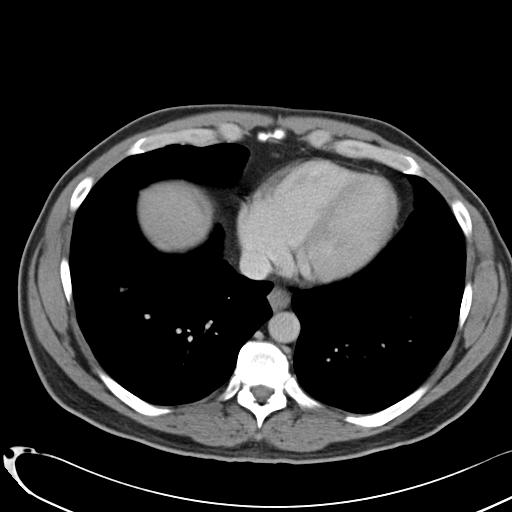

[Series 5: cor routine abd pel with · coronal · 0.71mm/px · 3 of 141 slices shown]
[im 47/141  soft-tissue]
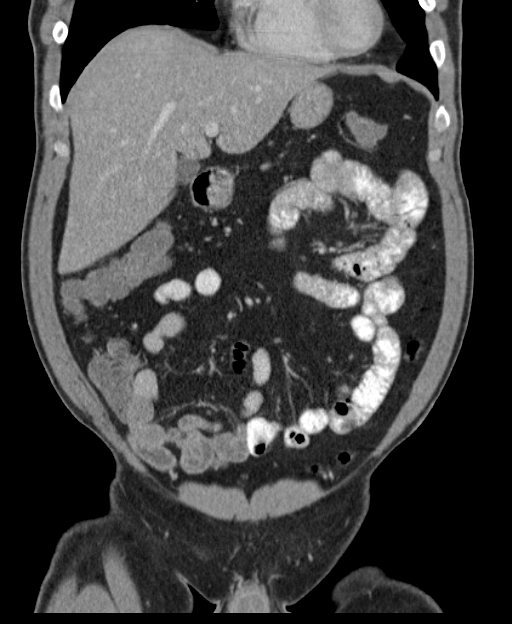
[im 63/141  soft-tissue]
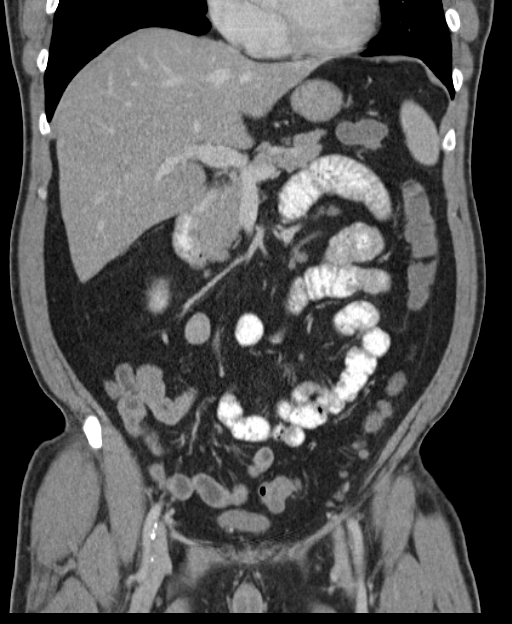
[im 78/141  soft-tissue]
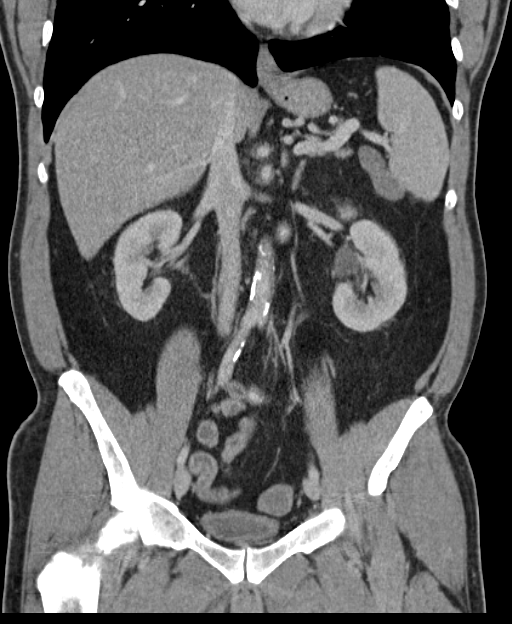

[16 of 46 positions shown; findings below may reference images not displayed]

FINDINGS: Lower chest:  Unremarkable

Hepatobiliary: Hepatic steatosis identified without focal hepatic
lesions. The gallbladder is unremarkable. There is no evidence of
biliary dilatation.

Pancreas: Unremarkable

Spleen: Unremarkable

Adrenals/Urinary Tract: The kidneys, adrenal glands and bladder are
unremarkable.

Stomach/Bowel: Focal diverticulitis changes of the mid sigmoid colon
again noted now with small adjacent focus of extraluminal gas.
Inflammation in this area has slightly decreased. There is no other
evidence of pneumoperitoneum or abscess. No bowel obstruction or
focal bowel wall thickening identified.

Vascular/Lymphatic: Aortic atherosclerotic calcifications noted
without aneurysm.

Reproductive: Prostate enlargement and calcifications again noted.

Other: No free fluid

Musculoskeletal: No acute or suspicious abnormalities.
IMPRESSION: Mid sigmoid colonic diverticulitis again identified with new focus
of adjacent extraluminal gas. Slightly decreased inflammation. No
other evidence of pneumoperitoneum or abscess.

Hepatic steatosis.

Aortic atherosclerosis without aneurysm.

## 2017-05-22 ENCOUNTER — Emergency Department
Admission: EM | Admit: 2017-05-22 | Discharge: 2017-05-22 | Disposition: A | Discharge status: Home / Self Care | Payer: Commercial Managed Care - PPO | Attending: Emergency Medicine | Admitting: Emergency Medicine

## 2017-05-22 ENCOUNTER — Emergency Department: Payer: Commercial Managed Care - PPO

## 2017-05-22 ENCOUNTER — Encounter: Payer: Self-pay | Admitting: Emergency Medicine

## 2017-05-22 DIAGNOSIS — E119 Type 2 diabetes mellitus without complications: Secondary | ICD-10-CM | POA: Insufficient documentation

## 2017-05-22 DIAGNOSIS — K5790 Diverticulosis of intestine, part unspecified, without perforation or abscess without bleeding: Secondary | ICD-10-CM | POA: Diagnosis not present

## 2017-05-22 DIAGNOSIS — Z79899 Other long term (current) drug therapy: Secondary | ICD-10-CM | POA: Insufficient documentation

## 2017-05-22 DIAGNOSIS — K5732 Diverticulitis of large intestine without perforation or abscess without bleeding: Secondary | ICD-10-CM | POA: Diagnosis not present

## 2017-05-22 DIAGNOSIS — I10 Essential (primary) hypertension: Secondary | ICD-10-CM | POA: Diagnosis not present

## 2017-05-22 DIAGNOSIS — R1032 Left lower quadrant pain: Secondary | ICD-10-CM

## 2017-05-22 DIAGNOSIS — Z7984 Long term (current) use of oral hypoglycemic drugs: Secondary | ICD-10-CM | POA: Insufficient documentation

## 2017-05-22 LAB — COMPREHENSIVE METABOLIC PANEL
ALT: 27 U/L (ref 17–63)
AST: 32 U/L (ref 15–41)
Albumin: 4.5 g/dL (ref 3.5–5.0)
Alkaline Phosphatase: 66 U/L (ref 38–126)
Anion gap: 9 (ref 5–15)
BUN: 10 mg/dL (ref 6–20)
CO2: 25 mmol/L (ref 22–32)
Calcium: 9.2 mg/dL (ref 8.9–10.3)
Chloride: 102 mmol/L (ref 101–111)
Creatinine, Ser: 1.12 mg/dL (ref 0.61–1.24)
GFR calc Af Amer: >60 mL/min (ref >60)
GFR calc non Af Amer: >60 mL/min (ref >60)
Glucose, Bld: 179 mg/dL — ABNORMAL HIGH (ref 65–99)
Potassium: 4 mmol/L (ref 3.5–5.1)
Sodium: 136 mmol/L (ref 135–145)
Total Bilirubin: 2.1 mg/dL — ABNORMAL HIGH (ref 0.3–1.2)
Total Protein: 7.8 g/dL (ref 6.5–8.1)

## 2017-05-22 LAB — CBC
HCT: 44.4 % (ref 40.0–52.0)
Hemoglobin: 15.2 g/dL (ref 13.0–18.0)
MCH: 29.5 pg (ref 26.0–34.0)
MCHC: 34.2 g/dL (ref 32.0–36.0)
MCV: 86.3 fL (ref 80.0–100.0)
Platelets: 174 10*3/uL (ref 150–440)
RBC: 5.15 MIL/uL (ref 4.40–5.90)
RDW: 14 % (ref 11.5–14.5)
WBC: 12.1 10*3/uL — ABNORMAL HIGH (ref 3.8–10.6)

## 2017-05-22 LAB — URINALYSIS, COMPLETE (UACMP) WITH MICROSCOPIC
Bacteria, UA: NONE SEEN
Bilirubin Urine: NEGATIVE
Glucose, UA: NEGATIVE mg/dL
Ketones, ur: NEGATIVE mg/dL
Leukocytes, UA: NEGATIVE
Nitrite: NEGATIVE
Protein, ur: NEGATIVE mg/dL
Specific Gravity, Urine: 1.02 (ref 1.005–1.030)
Squamous Epithelial / LPF: NONE SEEN
pH: 5 (ref 5.0–8.0)

## 2017-05-22 LAB — LIPASE, BLOOD: Lipase: 28 U/L (ref 11–51)

## 2017-05-22 MED ORDER — ONDANSETRON 4 MG PO TBDP
4.0000 mg | ORAL_TABLET | Freq: Once | ORAL | Status: AC
  Administered 2017-05-22: 4 mg via ORAL
  Filled 2017-05-22: qty 1

## 2017-05-22 MED ORDER — CIPROFLOXACIN HCL 500 MG PO TABS: 500.0000 mg | ORAL_TABLET | Freq: Two times a day (BID) | ORAL | 0 refills | Status: DC

## 2017-05-22 MED ORDER — METRONIDAZOLE 500 MG PO TABS: 500.0000 mg | ORAL_TABLET | Freq: Three times a day (TID) | ORAL | 0 refills | Status: DC

## 2017-05-22 MED ORDER — HYDROCODONE-ACETAMINOPHEN 5-325 MG PO TABS
1.0000 | ORAL_TABLET | ORAL | Status: AC
  Administered 2017-05-22: 1 via ORAL
  Filled 2017-05-22: qty 1

## 2017-05-22 MED ORDER — HYDROCODONE-ACETAMINOPHEN 5-325 MG PO TABS: 1.0000 | ORAL_TABLET | Freq: Four times a day (QID) | ORAL | 0 refills | Status: DC | PRN

## 2017-05-22 MED ORDER — IOPAMIDOL (ISOVUE-300) INJECTION 61%
100.0000 mL | Freq: Once | INTRAVENOUS | Status: AC | PRN
  Administered 2017-05-22: 100 mL via INTRAVENOUS

## 2017-05-22 MED ORDER — IOPAMIDOL (ISOVUE-300) INJECTION 61%
30.0000 mL | Freq: Once | INTRAVENOUS | Status: AC | PRN
  Administered 2017-05-22: 30 mL via ORAL

## 2017-05-22 MED ORDER — SODIUM CHLORIDE 0.9 % IV BOLUS (SEPSIS)
500.0000 mL | Freq: Once | INTRAVENOUS | Status: AC
  Administered 2017-05-22: 500 mL via INTRAVENOUS

## 2017-05-22 NOTE — Discharge Instructions (Signed)
We believe your symptoms are caused by diverticulitis.  Most of the time this condition (please read through the included information) can be cured with outpatient antibiotics.  Please take the full course of prescribed medication(s) and follow up with the doctors recommended above.  Return to the ED if your abdominal pain worsens or fails to improve, you develop bloody vomiting, bloody diarrhea, you are unable to tolerate fluids due to vomiting, fever greater than 101, or other symptoms that concern you.    

## 2017-05-22 NOTE — ED Notes (Signed)
Pt verbalized understanding of discharge instructions. NAD at this time. 

## 2017-05-22 NOTE — ED Triage Notes (Signed)
Pt ambulatory to triage in NAD, report lower abd pain and diarrhea starting last night, diarrhea x 6 times.  Pt reports hx of diverticulitis and states similar sx w/ past flare up.  Pt denies blood in stool.

## 2017-05-22 NOTE — ED Notes (Signed)
Pt states unable to give urine sample at this time 

## 2017-05-22 NOTE — ED Provider Notes (Signed)
[facility] Emergency Department Provider Note   ____________________________________________   None    (approximate)  I have reviewed the triage vital signs and the nursing notes.   HISTORY  Chief Complaint Abdominal Pain    HPI Edward Quinn is a 51 y.o. male here for evaluation of left lower abdominal discomfort and loose stools starting yesterday. Patient reports mild to moderate discomfort in left lower abdomen, began having loose watery nonbloody stools yesterday at work, then about 6 more during the overnight. Denies any chest pain or shortness of breath. No fevers or chills. Reports slightly fatigued. Pain he reports is relatively well controlled. He also reports he feels he has a mild feeling of a throbbing headache.  Has a history of diverticulitis in the past which she reports felt the exact same about a year ago. Had a colonoscopy in the last year. Reports he had a microperforation and had to be admitted to the hospital at that time.  No recent trips or travel. No recent antibiotic use. No history of C. difficile.   Past Medical History:  Diagnosis Date  . Arthritis    hands  . Diabetes mellitus without complication (HCC)   . Diverticulitis   . GERD (gastroesophageal reflux disease)   . Hyperlipidemia   . Hypertension     Patient Active Problem List   Diagnosis Date Noted  . Diverticulitis of colon (without mention of hemorrhage)(562.11)   . Diverticulosis of large intestine without diverticulitis   . Diverticulitis of large intestine with perforation without bleeding   . Diverticulitis of colon with perforation 05/03/2016    Past Surgical History:  Procedure Laterality Date  . ACHILLES TENDON REPAIR    . APPENDECTOMY    . COLONOSCOPY WITH PROPOFOL N/A 09/12/2016   Procedure: COLONOSCOPY WITH PROPOFOL;  Surgeon: Midge Minium, MD;  Location: [facility];  Service: Endoscopy;  Laterality: N/A;  Diabetic    Prior to  Admission medications   Medication Sig Start Date End Date Taking? Authorizing Provider  ciprofloxacin (CIPRO) 500 MG tablet Take 1 tablet (500 mg total) by mouth 2 (two) times daily. 05/22/17   Sharyn Creamer, MD  HYDROcodone-acetaminophen (NORCO/VICODIN) 5-325 MG tablet Take 1 tablet by mouth every 6 (six) hours as needed for moderate pain. 05/22/17   Sharyn Creamer, MD  lisinopril (PRINIVIL,ZESTRIL) 10 MG tablet Take 10 mg by mouth daily. 04/12/16   [provider]  lovastatin (MEVACOR) 10 MG tablet Take 10 mg by mouth every morning.  04/12/16   [provider]  metFORMIN (GLUCOPHAGE) 500 MG tablet Take 500 mg by mouth 2 (two) times daily. 04/12/16   [provider]  metroNIDAZOLE (FLAGYL) 500 MG tablet Take 1 tablet (500 mg total) by mouth 3 (three) times daily. 05/22/17   Sharyn Creamer, MD  promethazine (PHENERGAN) 12.5 MG tablet Take 1 tablet (12.5 mg total) by mouth every 6 (six) hours as needed for nausea or vomiting. Patient not taking: Reported on 09/05/2016 05/18/16   Jennye Moccasin, MD   Discussed with patient that he will need to wait 48 hours after his CAT scan before resuming metformin. He is agreeable.  Allergies Patient has no known allergies.  Family History  Problem Relation Age of Onset  . Epilepsy Mother   . Cancer Paternal Aunt        lung    Social History Social History  Substance Use Topics  . Smoking status: Never Smoker  . Smokeless tobacco: Never Used  . Alcohol  use 4.2 oz/week    7 Cans of beer per week     Comment: ocass    Review of Systems Constitutional: No fever/chills Eyes: No visual changes. ENT: No sore throat. Cardiovascular: Denies chest pain. Respiratory: Denies shortness of breath. Gastrointestinal: No vomiting. Very mild nausea.  No diarrhea.  No constipation. Genitourinary: Negative for dysuria. Musculoskeletal: Negative for back pain. Skin: Negative for rash. Neurological: Negative for headaches, focal weakness or  numbness.    ____________________________________________   PHYSICAL EXAM:  VITAL SIGNS: ED Triage Vitals [05/22/17 0551]  Enc Vitals Group     BP (!) 142/92     Pulse Rate (!) 102     Resp 18     Temp 98.7 F (37.1 C)     Temp Source Oral     SpO2 97 %     Weight 170 lb (77.1 kg)     Height 5\' 6"  (1.676 m)     Head Circumference      Peak Flow      Pain Score 6     Pain Loc      Pain Edu?      Excl. in GC?     Constitutional: Alert and oriented. Well appearing and in no acute distress. Eyes: Conjunctivae are normal. Head: Atraumatic. Nose: No congestion/rhinnorhea. Mouth/Throat: Mucous membranes are moist. Neck: No stridor.   Cardiovascular: Normal rate, regular rhythm. Grossly normal heart sounds.  Good peripheral circulation. Respiratory: Normal respiratory effort.  No retractions. Lungs CTAB. Gastrointestinal: Soft and nontender on the right side, with a negative Murphy and no pain at McBurney's point, however he does report moderate tenderness in the left lower quadrant without distention or rigidity. There is mild peritonitis to percussion in the left lower quadrant. No distention. No hernias. Musculoskeletal: No lower extremity tenderness nor edema. Neurologic:  Normal speech and language. No gross focal neurologic deficits are appreciated.  Skin:  Skin is warm, dry and intact. No rash noted. Psychiatric: Mood and affect are normal. Speech and behavior are normal.  ____________________________________________   LABS (all labs ordered are listed, but only abnormal results are displayed)  Labs Reviewed  COMPREHENSIVE METABOLIC PANEL - Abnormal; Notable for the following:       Result Value   Glucose, Bld 179 (*)    Total Bilirubin 2.1 (*)    All other components within normal limits  CBC - Abnormal; Notable for the following:    WBC 12.1 (*)    All other components within normal limits  URINALYSIS, COMPLETE (UACMP) WITH MICROSCOPIC - Abnormal; Notable for  the following:    Color, Urine YELLOW (*)    APPearance CLEAR (*)    Hgb urine dipstick SMALL (*)    All other components within normal limits  LIPASE, BLOOD   ____________________________________________  EKG  Reviewed and interpreted by me at 7:30 AM Heart rate 80 Care is 95 QTC 440 Normal sinus rhythm, no evidence of ischemia noted. Slight baseline wander is noted in aVF ____________________________________________  RADIOLOGY  Ct Abdomen Pelvis W Contrast  Result Date: 05/22/2017 CLINICAL DATA:  Abdominal pain and diarrhea, history of diverticulitis EXAM: CT ABDOMEN AND PELVIS WITH CONTRAST TECHNIQUE: Multidetector CT imaging of the abdomen and pelvis was performed using the standard protocol following bolus administration of intravenous contrast. CONTRAST:  ISOVUE-300 IOPAMIDOL (ISOVUE-300) INJECTION 61% COMPARISON:  05/18/2016 FINDINGS: Lower chest: No acute abnormality. Hepatobiliary: The liver is mildly fatty infiltrated. The gallbladder is within normal limits. Pancreas: Unremarkable. No pancreatic ductal  dilatation or surrounding inflammatory changes. Spleen: Normal in size without focal abnormality. Adrenals/Urinary Tract: Adrenal glands are unremarkable. Kidneys are normal, without renal calculi, focal lesion, or hydronephrosis. Bladder is unremarkable. Stomach/Bowel: The appendix is not visualize consistent with a prior surgical history. Scattered diverticular changes noted. No definitive diverticulitis is seen. Vascular/Lymphatic: Aortic atherosclerosis. No enlarged abdominal or pelvic lymph nodes. Reproductive: Prostate is unremarkable. Other: No abdominal wall hernia or abnormality. No abdominopelvic ascites. Musculoskeletal: No acute or significant osseous findings. IMPRESSION: Diverticulosis without evidence of diverticulitis. No acute abnormality noted. Electronically Signed   By: Alcide CleverMark  Lukens M.D.   On: 05/22/2017 08:44     ____________________________________________   PROCEDURES  Procedure(s) performed: None  Procedures  Critical Care performed: No  ____________________________________________   INITIAL IMPRESSION / ASSESSMENT AND PLAN / ED COURSE  Pertinent labs & imaging results that were available during my care of the patient were reviewed by me and considered in my medical decision making (see chart for details).  Differential diagnosis includes but is not limited to, abdominal perforation, aortic dissection, cholecystitis, appendicitis, diverticulitis, colitis, esophagitis/gastritis, kidney stone, pyelonephritis, urinary tract infection, aortic aneurysm. All are considered in decision and treatment plan. Based upon the patient's presentation and risk factors, we'll proceed with CT scan to further evaluate as I suspect likely diverticulitis, but given his history of perforation in the past the important to exclude this. He is taking by mouth well, and appears hemodynamically stable, nontoxic and likely would be appropriate for outpatient management if diverticulitis is confirmed on CT unless complication is noted.     ----------------------------------------- 9:19 AM on 05/22/2017 -----------------------------------------  I will prescribe the patient a narcotic pain medicine due to their condition which I anticipate will cause at least moderate pain short term. I discussed with the patient safe use of narcotic pain medicines, and that they are not to drive, work in dangerous areas, or ever take more than prescribed (no more than 1 pill every 6 hours). We discussed that this is the type of medication that can be  overdosed on and the risks of this type of medicine. Patient is very agreeable to only use as prescribed and to never use more than prescribed.  Patient reports he feels well. Agreeable with careful return precautions. Based on his clinical exam and previous history, as well as imaging  today suspect likely very mild diverticulitis at this time. No evidence of perforation. There is a relatively high incidence of mild diverticulitis not being well visualized on CT, and thus I suspect likely have not yet developed enough inflammatory changes to obviously show the early evidence of flare. I did discuss careful return precautions and the patient is agreeable.  His fianc is driving him home today ____________________________________________   FINAL CLINICAL IMPRESSION(S) / ED DIAGNOSES  Final diagnoses:  LLQ pain  Diverticulosis of intestine without bleeding, unspecified intestinal tract location  Diverticulitis of large intestine without perforation or abscess without bleeding      NEW MEDICATIONS STARTED DURING THIS VISIT:  New Prescriptions   CIPROFLOXACIN (CIPRO) 500 MG TABLET    Take 1 tablet (500 mg total) by mouth 2 (two) times daily.   HYDROCODONE-ACETAMINOPHEN (NORCO/VICODIN) 5-325 MG TABLET    Take 1 tablet by mouth every 6 (six) hours as needed for moderate pain.   METRONIDAZOLE (FLAGYL) 500 MG TABLET    Take 1 tablet (500 mg total) by mouth 3 (three) times daily.     Note:  This document was prepared using [employer] voice recognition  software and may include unintentional dictation errors.     Sharyn Creamer, MD 05/22/17 [phone]

## 2018-02-24 ENCOUNTER — Emergency Department: Payer: Commercial Managed Care - PPO

## 2018-02-24 ENCOUNTER — Emergency Department
Admission: EM | Admit: 2018-02-24 | Discharge: 2018-02-24 | Disposition: A | Discharge status: Home / Self Care | Payer: Commercial Managed Care - PPO | Attending: Emergency Medicine | Admitting: Emergency Medicine

## 2018-02-24 ENCOUNTER — Other Ambulatory Visit: Payer: Self-pay

## 2018-02-24 DIAGNOSIS — Z7984 Long term (current) use of oral hypoglycemic drugs: Secondary | ICD-10-CM | POA: Diagnosis not present

## 2018-02-24 DIAGNOSIS — E119 Type 2 diabetes mellitus without complications: Secondary | ICD-10-CM | POA: Insufficient documentation

## 2018-02-24 DIAGNOSIS — I10 Essential (primary) hypertension: Secondary | ICD-10-CM | POA: Insufficient documentation

## 2018-02-24 DIAGNOSIS — B9789 Other viral agents as the cause of diseases classified elsewhere: Secondary | ICD-10-CM | POA: Diagnosis not present

## 2018-02-24 DIAGNOSIS — J069 Acute upper respiratory infection, unspecified: Secondary | ICD-10-CM | POA: Diagnosis not present

## 2018-02-24 DIAGNOSIS — R05 Cough: Secondary | ICD-10-CM | POA: Diagnosis present

## 2018-02-24 DIAGNOSIS — Z79899 Other long term (current) drug therapy: Secondary | ICD-10-CM | POA: Insufficient documentation

## 2018-02-24 LAB — COMPREHENSIVE METABOLIC PANEL
ALT: 24 U/L (ref 17–63)
AST: 30 U/L (ref 15–41)
Albumin: 5 g/dL (ref 3.5–5.0)
Alkaline Phosphatase: 78 U/L (ref 38–126)
Anion gap: 10 (ref 5–15)
BUN: 14 mg/dL (ref 6–20)
CO2: 25 mmol/L (ref 22–32)
Calcium: 9 mg/dL (ref 8.9–10.3)
Chloride: 99 mmol/L — ABNORMAL LOW (ref 101–111)
Creatinine, Ser: 1.13 mg/dL (ref 0.61–1.24)
GFR calc Af Amer: >60 mL/min (ref >60)
GFR calc non Af Amer: >60 mL/min (ref >60)
Glucose, Bld: 178 mg/dL — ABNORMAL HIGH (ref 65–99)
Potassium: 4.1 mmol/L (ref 3.5–5.1)
Sodium: 134 mmol/L — ABNORMAL LOW (ref 135–145)
Total Bilirubin: 1.8 mg/dL — ABNORMAL HIGH (ref 0.3–1.2)
Total Protein: 8.3 g/dL — ABNORMAL HIGH (ref 6.5–8.1)

## 2018-02-24 LAB — CBC
HCT: 46.7 % (ref 40.0–52.0)
Hemoglobin: 15.9 g/dL (ref 13.0–18.0)
MCH: 29.5 pg (ref 26.0–34.0)
MCHC: 34.1 g/dL (ref 32.0–36.0)
MCV: 86.6 fL (ref 80.0–100.0)
Platelets: 183 10*3/uL (ref 150–440)
RBC: 5.39 MIL/uL (ref 4.40–5.90)
RDW: 13.5 % (ref 11.5–14.5)
WBC: 7.4 10*3/uL (ref 3.8–10.6)

## 2018-02-24 LAB — URINALYSIS, COMPLETE (UACMP) WITH MICROSCOPIC
Bacteria, UA: NONE SEEN
Bilirubin Urine: NEGATIVE
Glucose, UA: NEGATIVE mg/dL
Hgb urine dipstick: NEGATIVE
Ketones, ur: 5 mg/dL — AB
Leukocytes, UA: NEGATIVE
Nitrite: NEGATIVE
Protein, ur: NEGATIVE mg/dL
Specific Gravity, Urine: 1.026 (ref 1.005–1.030)
Squamous Epithelial / LPF: NONE SEEN
pH: 5 (ref 5.0–8.0)

## 2018-02-24 LAB — LIPASE, BLOOD: Lipase: 35 U/L (ref 11–51)

## 2018-02-24 MED ORDER — ONDANSETRON 4 MG PO TBDP
4.0000 mg | ORAL_TABLET | Freq: Once | ORAL | Status: AC
  Administered 2018-02-24: 4 mg via ORAL
  Filled 2018-02-24: qty 1

## 2018-02-24 MED ORDER — GUAIFENESIN-CODEINE 100-10 MG/5ML PO SOLN: 5.0000 mL | Freq: Three times a day (TID) | ORAL | 0 refills | Status: DC | PRN

## 2018-02-24 MED ORDER — ONDANSETRON 4 MG PO TBDP: 4.0000 mg | ORAL_TABLET | Freq: Three times a day (TID) | ORAL | 0 refills | Status: DC | PRN

## 2018-02-24 NOTE — ED Provider Notes (Signed)
[facility] Emergency Department Provider Note  ____________________________________________  Time seen: Approximately 7:05 PM  I have reviewed the triage vital signs and the nursing notes.   HISTORY  Chief Complaint URI and Diarrhea   HPI Edward Quinn is a 52 y.o. male with a history of hypertension, hyperlipidemia, and diabetes on metformin who presents for evaluation of cough and congestion. Patient reports 6 days of productive moderate constant cough and congestion. No shortness of breath or chest pain. No fever or chills. Since this morning patient has had nausea and had 2 episodes of watery diarrhea. No vomiting, no abdominal pain, no sore throat, no body aches.  Past Medical History:  Diagnosis Date  . Arthritis    hands  . Diabetes mellitus without complication (HCC)   . Diverticulitis   . GERD (gastroesophageal reflux disease)   . Hyperlipidemia   . Hypertension     Patient Active Problem List   Diagnosis Date Noted  . Diverticulitis of colon (without mention of hemorrhage)(562.11)   . Diverticulosis of large intestine without diverticulitis   . Diverticulitis of large intestine with perforation without bleeding   . Diverticulitis of colon with perforation 05/03/2016    Past Surgical History:  Procedure Laterality Date  . ACHILLES TENDON REPAIR    . APPENDECTOMY    . COLONOSCOPY WITH PROPOFOL N/A 09/12/2016   Procedure: COLONOSCOPY WITH PROPOFOL;  Surgeon: Midge Miniumarren Wohl, MD;  Location[facility];  Service: Endoscopy;  Laterality: N/A;  Diabetic    Prior to Admission medications   Medication Sig Start Date End Date Taking? Authorizing Provider  ciprofloxacin (CIPRO) 500 MG tablet Take 1 tablet (500 mg total) by mouth 2 (two) times daily. 05/22/17   Sharyn CreamerQuale, Mark, MD  guaiFENesin-codeine 100-10 MG/5ML syrup Take 5 mLs by mouth 3 (three) times daily as needed for cough. 02/24/18   Nita SickleVeronese, [place], MD  HYDROcodone-acetaminophen  (NORCO/VICODIN) 5-325 MG tablet Take 1 tablet by mouth every 6 (six) hours as needed for moderate pain. 05/22/17   Sharyn CreamerQuale, Mark, MD  lisinopril (PRINIVIL,ZESTRIL) 10 MG tablet Take 10 mg by mouth daily. 04/12/16   [provider]  lovastatin (MEVACOR) 10 MG tablet Take 10 mg by mouth every morning.  04/12/16   [provider]  metFORMIN (GLUCOPHAGE) 500 MG tablet Take 500 mg by mouth 2 (two) times daily. 04/12/16   [provider]  metroNIDAZOLE (FLAGYL) 500 MG tablet Take 1 tablet (500 mg total) by mouth 3 (three) times daily. 05/22/17   Sharyn CreamerQuale, Mark, MD  ondansetron (ZOFRAN ODT) 4 MG disintegrating tablet Take 1 tablet (4 mg total) by mouth every 8 (eight) hours as needed for nausea or vomiting. 02/24/18   Don PerkingVeronese, [place], MD  promethazine (PHENERGAN) 12.5 MG tablet Take 1 tablet (12.5 mg total) by mouth every 6 (six) hours as needed for nausea or vomiting. Patient not taking: Reported on 09/05/2016 05/18/16   Jennye MoccasinQuigley, Brian S, MD    Allergies Patient has no known allergies.  Family History  Problem Relation Age of Onset  . Epilepsy Mother   . Cancer Paternal Aunt        lung    Social History Social History   Tobacco Use  . Smoking status: Never Smoker  . Smokeless tobacco: Never Used  Substance Use Topics  . Alcohol use: Yes    Alcohol/week: 4.2 oz    Types: 7 Cans of beer per week    Comment: ocass  . Drug use: No    Review  of Systems  Constitutional: Negative for fever. Eyes: Negative for visual changes. ENT: Negative for sore throat. + congestion Neck: No neck pain  Cardiovascular: Negative for chest pain. Respiratory: Negative for shortness of breath. + cough Gastrointestinal: Negative for abdominal pain, vomiting. + nausea, and diarrhea. Genitourinary: Negative for dysuria. Musculoskeletal: Negative for back pain. Skin: Negative for rash. Neurological: Negative for headaches, weakness or numbness. Psych: No SI or  HI  ____________________________________________   PHYSICAL EXAM:  VITAL SIGNS: ED Triage Vitals [02/24/18 1723]  Enc Vitals Group     BP 137/85     Pulse Rate 83     Resp      Temp 98.2 F (36.8 C)     Temp Source Oral     SpO2 100 %     Weight 167 lb (75.8 kg)     Height 5\' 6"  (1.676 m)     Head Circumference      Peak Flow      Pain Score 0     Pain Loc      Pain Edu?      Excl. in GC?     Constitutional: Alert and oriented. Well appearing and in no apparent distress. HEENT:      Head: Normocephalic and atraumatic.         Eyes: Conjunctivae are normal. Sclera is non-icteric.       Mouth/Throat: Mucous membranes are moist.       Neck: Supple with no signs of meningismus. Cardiovascular: Regular rate and rhythm. No murmurs, gallops, or rubs. 2+ symmetrical distal pulses are present in all extremities. No JVD. Respiratory: Normal respiratory effort. Lungs are clear to auscultation bilaterally. No wheezes, crackles, or rhonchi.  Gastrointestinal: Soft, non tender, and non distended with positive bowel sounds. No rebound or guarding. Genitourinary: No CVA tenderness. Musculoskeletal: Nontender with normal range of motion in all extremities. No edema, cyanosis, or erythema of extremities. Neurologic: Normal speech and language. Face is symmetric. Moving all extremities. No gross focal neurologic deficits are appreciated. Skin: Skin is warm, dry and intact. No rash noted. Psychiatric: Mood and affect are normal. Speech and behavior are normal.  ____________________________________________   LABS (all labs ordered are listed, but only abnormal results are displayed)  Labs Reviewed  COMPREHENSIVE METABOLIC PANEL - Abnormal; Notable for the following components:      Result Value   Sodium 134 (*)    Chloride 99 (*)    Glucose, Bld 178 (*)    Total Protein 8.3 (*)    Total Bilirubin 1.8 (*)    All other components within normal limits  URINALYSIS, COMPLETE (UACMP)  WITH MICROSCOPIC - Abnormal; Notable for the following components:   Color, Urine YELLOW (*)    APPearance CLEAR (*)    Ketones, ur 5 (*)    All other components within normal limits  LIPASE, BLOOD  CBC   ____________________________________________  EKG  none  ____________________________________________  RADIOLOGY  I have personally reviewed the images performed during this visit and I agree with the Radiologist's read.   Interpretation by Radiologist:  Dg Chest 2 View  Result Date: 02/24/2018 CLINICAL DATA:  52 year old male with cold symptoms. Cough, sinus pressure and congestion for 1 week. Nausea and diarrhea beginning today. EXAM: CHEST - 2 VIEW COMPARISON:  CT Abdomen and Pelvis 05/22/2017. FINDINGS: Normal cardiac size and mediastinal contours. Visualized tracheal air column is within normal limits. Normal lung volumes. Both lungs appear clear. No pneumothorax or pleural effusion. No pneumoperitoneum. Negative  visible bowel gas pattern. No acute osseous abnormality identified. IMPRESSION: Negative.  No acute cardiopulmonary abnormality. Electronically Signed   By: Odessa Fleming M.D.   On: 02/24/2018 19:26      ____________________________________________   PROCEDURES  Procedure(s) performed: None Procedures Critical Care performed:  None ____________________________________________   INITIAL IMPRESSION / ASSESSMENT AND PLAN / ED COURSE  52 y.o. male with a history of hypertension, hyperlipidemia, and diabetes on metformin who presents for evaluation of cough and congestion x 6 days and now nausea and diarrhea. patient extremely well appearing, no distress, is normal vital signs, lungs are clear to auscultation, abdomen is soft. Labs are within normal limits. Chest x-ray negative for pneumonia. Presentation concerning for viral URI. Patient will be discharged home on Robitussin and Zofran. Discussed return precautions and follow-up with primary care doctor.      As part  of my medical decision making, I reviewed the following data within the electronic MEDICAL RECORD NUMBER Nursing notes reviewed and incorporated, Labs reviewed , Radiograph reviewed , Notes from prior ED visits and [place] Controlled Substance Database    Pertinent labs & imaging results that were available during my care of the patient were reviewed by me and considered in my medical decision making (see chart for details).    ____________________________________________   FINAL CLINICAL IMPRESSION(S) / ED DIAGNOSES  Final diagnoses:  Viral URI with cough      NEW MEDICATIONS STARTED DURING THIS VISIT:  ED Discharge Orders        Ordered    guaiFENesin-codeine 100-10 MG/5ML syrup  3 times daily PRN     02/24/18 1904    ondansetron (ZOFRAN ODT) 4 MG disintegrating tablet  Every 8 hours PRN     02/24/18 1904       Note:  This document was prepared using Dragon voice recognition software and may include unintentional dictation errors.    Don Perking, [place], MD 02/24/18 [phone]

## 2018-02-24 NOTE — ED Triage Notes (Signed)
Pt c/o having a head cold, sinus pressure/congestion since Thursday. States today having nausea and diarrhea. Denies vomiting or bloody stools..Marland Kitchen

## 2018-02-24 NOTE — ED Notes (Signed)
Patient transported to X-ray 

## 2018-02-24 NOTE — ED Notes (Signed)
Pt to the er for flu like symptoms. Pt denies vomiting at this time but some coughing. No distress noted.

## 2019-01-27 ENCOUNTER — Emergency Department
Admission: EM | Admit: 2019-01-27 | Discharge: 2019-01-27 | Disposition: A | Discharge status: Home / Self Care | Payer: Commercial Managed Care - PPO | Attending: Emergency Medicine | Admitting: Emergency Medicine

## 2019-01-27 DIAGNOSIS — Z7984 Long term (current) use of oral hypoglycemic drugs: Secondary | ICD-10-CM | POA: Insufficient documentation

## 2019-01-27 DIAGNOSIS — E119 Type 2 diabetes mellitus without complications: Secondary | ICD-10-CM | POA: Insufficient documentation

## 2019-01-27 DIAGNOSIS — M10071 Idiopathic gout, right ankle and foot: Secondary | ICD-10-CM | POA: Diagnosis not present

## 2019-01-27 DIAGNOSIS — M109 Gout, unspecified: Secondary | ICD-10-CM

## 2019-01-27 DIAGNOSIS — Z79899 Other long term (current) drug therapy: Secondary | ICD-10-CM | POA: Diagnosis not present

## 2019-01-27 DIAGNOSIS — I10 Essential (primary) hypertension: Secondary | ICD-10-CM | POA: Insufficient documentation

## 2019-01-27 DIAGNOSIS — M79671 Pain in right foot: Secondary | ICD-10-CM | POA: Diagnosis present

## 2019-01-27 MED ORDER — NAPROXEN 500 MG PO TABS: 500.0000 mg | ORAL_TABLET | Freq: Two times a day (BID) | ORAL | 2 refills | Status: DC

## 2019-01-27 MED ORDER — PREDNISONE 50 MG PO TABS: 50.0000 mg | ORAL_TABLET | Freq: Every day | ORAL | 0 refills | Status: DC

## 2019-01-27 MED ORDER — OXYCODONE-ACETAMINOPHEN 5-325 MG PO TABS
1.0000 | ORAL_TABLET | Freq: Once | ORAL | Status: AC
  Administered 2019-01-27: 1 via ORAL
  Filled 2019-01-27: qty 1

## 2019-01-27 MED ORDER — KETOROLAC TROMETHAMINE 30 MG/ML IJ SOLN
30.0000 mg | Freq: Once | INTRAMUSCULAR | Status: AC
  Administered 2019-01-27: 30 mg via INTRAMUSCULAR
  Filled 2019-01-27: qty 1

## 2019-01-27 MED ORDER — PREDNISONE 20 MG PO TABS
60.0000 mg | ORAL_TABLET | Freq: Once | ORAL | Status: AC
  Administered 2019-01-27: 60 mg via ORAL
  Filled 2019-01-27: qty 3

## 2019-01-27 NOTE — ED Triage Notes (Signed)
Patient c/o right foot pain. Patient reports hx of gout - reports this feels the same.

## 2019-01-27 NOTE — ED Provider Notes (Signed)
[facility] Emergency Department Provider Note   ____________________________________________    I have reviewed the triage vital signs and the nursing notes.   HISTORY  Chief Complaint Foot Pain (right)     HPI Edward Quinn is a 53 y.o. male who presents with complaints of right foot pain.  Patient reports he developed mild pain on the medial aspect of his right foot earlier today, when he went to the bathroom recently the pain became more severe and he had to crawl back to his bed.  He reports this feels similar to gout that he has had before.  Denies injury to the area.  Is not take anything for this.  Past Medical History:  Diagnosis Date  . Arthritis    hands  . Diabetes mellitus without complication (HCC)   . Diverticulitis   . GERD (gastroesophageal reflux disease)   . Hyperlipidemia   . Hypertension     Patient Active Problem List   Diagnosis Date Noted  . Diverticulitis of colon (without mention of hemorrhage)(562.11)   . Diverticulosis of large intestine without diverticulitis   . Diverticulitis of large intestine with perforation without bleeding   . Diverticulitis of colon with perforation 05/03/2016    Past Surgical History:  Procedure Laterality Date  . ACHILLES TENDON REPAIR    . APPENDECTOMY    . COLONOSCOPY WITH PROPOFOL N/A 09/12/2016   Procedure: COLONOSCOPY WITH PROPOFOL;  Surgeon: Midge Minium, MD;  Location: [facility];  Service: Endoscopy;  Laterality: N/A;  Diabetic    Prior to Admission medications   Medication Sig Start Date End Date Taking? Authorizing Provider  ciprofloxacin (CIPRO) 500 MG tablet Take 1 tablet (500 mg total) by mouth 2 (two) times daily. 05/22/17   Sharyn Creamer, MD  guaiFENesin-codeine 100-10 MG/5ML syrup Take 5 mLs by mouth 3 (three) times daily as needed for cough. 02/24/18   Nita Sickle, MD  HYDROcodone-acetaminophen (NORCO/VICODIN) 5-325 MG tablet Take 1 tablet by mouth every 6  (six) hours as needed for moderate pain. 05/22/17   Sharyn Creamer, MD  lisinopril (PRINIVIL,ZESTRIL) 10 MG tablet Take 10 mg by mouth daily. 04/12/16   [provider]  lovastatin (MEVACOR) 10 MG tablet Take 10 mg by mouth every morning.  04/12/16   [provider]  metFORMIN (GLUCOPHAGE) 500 MG tablet Take 500 mg by mouth 2 (two) times daily. 04/12/16   [provider]  metroNIDAZOLE (FLAGYL) 500 MG tablet Take 1 tablet (500 mg total) by mouth 3 (three) times daily. 05/22/17   Sharyn Creamer, MD  naproxen (NAPROSYN) 500 MG tablet Take 1 tablet (500 mg total) by mouth 2 (two) times daily with a meal. 01/27/19   Jene Every, MD  ondansetron (ZOFRAN ODT) 4 MG disintegrating tablet Take 1 tablet (4 mg total) by mouth every 8 (eight) hours as needed for nausea or vomiting. 02/24/18   Don Perking, [place], MD  predniSONE (DELTASONE) 50 MG tablet Take 1 tablet (50 mg total) by mouth daily with breakfast. 01/27/19   Jene Every, MD  promethazine (PHENERGAN) 12.5 MG tablet Take 1 tablet (12.5 mg total) by mouth every 6 (six) hours as needed for nausea or vomiting. Patient not taking: Reported on 09/05/2016 05/18/16   Jennye Moccasin, MD     Allergies Patient has no known allergies.  Family History  Problem Relation Age of Onset  . Epilepsy Mother   . Cancer Paternal Aunt        lung    Social  History Social History   Tobacco Use  . Smoking status: Never Smoker  . Smokeless tobacco: Never Used  Substance Use Topics  . Alcohol use: Yes    Alcohol/week: 7.0 standard drinks    Types: 7 Cans of beer per week    Comment: ocass  . Drug use: No    Review of Systems  Constitutional: No fever/chills   Musculoskeletal: As above Skin: Negative for rash.    ____________________________________________   PHYSICAL EXAM:  VITAL SIGNS: ED Triage Vitals  Enc Vitals Group     BP 01/27/19 0204 (!) 133/99     Pulse Rate 01/27/19 0204 98     Resp 01/27/19 0204 18     Temp  01/27/19 0204 97.9 F (36.6 C)     Temp Source 01/27/19 0204 Oral     SpO2 01/27/19 0204 96 %     Weight 01/27/19 0201 77.1 kg (170 lb)     Height 01/27/19 0201 1.676 m (5\' 6" )     Head Circumference --      Peak Flow --      Pain Score 01/27/19 0201 8     Pain Loc --      Pain Edu? --      Excl. in GC? --     Constitutional: Alert and oriented.  Nose: No congestion/rhinnorhea.  Cardiovascular: Normal rate, regular rhythm.  Good peripheral circulation. Respiratory: Normal respiratory effort.  No retractions.   Musculoskeletal: Mild swelling with some erythema to the medial aspect of the right midfoot consistent with gout, warm and well perfused, no bruising or evidence of trauma trauma  Skin:  Skin is warm, dry and intact.   ____________________________________________   LABS (all labs ordered are listed, but only abnormal results are displayed)  Labs Reviewed - No data to display ____________________________________________  EKG  None ____________________________________________  RADIOLOGY  None ____________________________________________   PROCEDURES  Procedure(s) performed: No  Procedures   Critical Care performed: No ____________________________________________   INITIAL IMPRESSION / ASSESSMENT AND PLAN / ED COURSE  Pertinent labs & imaging results that were available during my care of the patient were reviewed by me and considered in my medical decision making (see chart for details).  Patient's exam is most consistent with gout, will treat with IM Toradol, Percocet, prednisone and discharge with naproxen and prednisone    ____________________________________________   FINAL CLINICAL IMPRESSION(S) / ED DIAGNOSES  Final diagnoses:  Acute gout of right foot, unspecified cause        Note:  This document was prepared using Dragon voice recognition software and may include unintentional dictation errors.   Jene Every, MD 01/27/19  [id number]

## 2019-07-18 ENCOUNTER — Emergency Department: Payer: Commercial Managed Care - PPO

## 2019-07-18 ENCOUNTER — Inpatient Hospital Stay
Admission: EM | Admit: 2019-07-18 | Discharge: 2019-07-20 | DRG: 392 | Disposition: A | Discharge status: Home / Self Care | Payer: Commercial Managed Care - PPO | Attending: Internal Medicine | Admitting: Internal Medicine

## 2019-07-18 ENCOUNTER — Other Ambulatory Visit: Payer: Self-pay

## 2019-07-18 DIAGNOSIS — Z801 Family history of malignant neoplasm of trachea, bronchus and lung: Secondary | ICD-10-CM | POA: Diagnosis not present

## 2019-07-18 DIAGNOSIS — E785 Hyperlipidemia, unspecified: Secondary | ICD-10-CM | POA: Diagnosis present

## 2019-07-18 DIAGNOSIS — K219 Gastro-esophageal reflux disease without esophagitis: Secondary | ICD-10-CM | POA: Diagnosis present

## 2019-07-18 DIAGNOSIS — K5792 Diverticulitis of intestine, part unspecified, without perforation or abscess without bleeding: Secondary | ICD-10-CM | POA: Diagnosis present

## 2019-07-18 DIAGNOSIS — E119 Type 2 diabetes mellitus without complications: Secondary | ICD-10-CM | POA: Diagnosis present

## 2019-07-18 DIAGNOSIS — R1032 Left lower quadrant pain: Secondary | ICD-10-CM

## 2019-07-18 DIAGNOSIS — K5732 Diverticulitis of large intestine without perforation or abscess without bleeding: Secondary | ICD-10-CM | POA: Diagnosis not present

## 2019-07-18 DIAGNOSIS — Z7984 Long term (current) use of oral hypoglycemic drugs: Secondary | ICD-10-CM | POA: Diagnosis not present

## 2019-07-18 DIAGNOSIS — Z20828 Contact with and (suspected) exposure to other viral communicable diseases: Secondary | ICD-10-CM | POA: Diagnosis present

## 2019-07-18 DIAGNOSIS — M19041 Primary osteoarthritis, right hand: Secondary | ICD-10-CM | POA: Diagnosis present

## 2019-07-18 DIAGNOSIS — I10 Essential (primary) hypertension: Secondary | ICD-10-CM | POA: Diagnosis present

## 2019-07-18 DIAGNOSIS — Z7952 Long term (current) use of systemic steroids: Secondary | ICD-10-CM | POA: Diagnosis not present

## 2019-07-18 DIAGNOSIS — R109 Unspecified abdominal pain: Secondary | ICD-10-CM | POA: Diagnosis not present

## 2019-07-18 DIAGNOSIS — Z79899 Other long term (current) drug therapy: Secondary | ICD-10-CM | POA: Diagnosis not present

## 2019-07-18 DIAGNOSIS — Z791 Long term (current) use of non-steroidal anti-inflammatories (NSAID): Secondary | ICD-10-CM

## 2019-07-18 DIAGNOSIS — Z79891 Long term (current) use of opiate analgesic: Secondary | ICD-10-CM

## 2019-07-18 DIAGNOSIS — M19042 Primary osteoarthritis, left hand: Secondary | ICD-10-CM | POA: Diagnosis present

## 2019-07-18 DIAGNOSIS — Z820 Family history of epilepsy and other diseases of the nervous system: Secondary | ICD-10-CM | POA: Diagnosis not present

## 2019-07-18 LAB — CBC WITH DIFFERENTIAL/PLATELET
Abs Immature Granulocytes: 0.03 10*3/uL (ref 0.00–0.07)
Basophils Absolute: 0.1 10*3/uL (ref 0.0–0.1)
Basophils Relative: 1 %
Eosinophils Absolute: 0.2 10*3/uL (ref 0.0–0.5)
Eosinophils Relative: 2 %
HCT: 46.9 % (ref 39.0–52.0)
Hemoglobin: 16.1 g/dL (ref 13.0–17.0)
Immature Granulocytes: 0 %
Lymphocytes Relative: 12 %
Lymphs Abs: 1.2 10*3/uL (ref 0.7–4.0)
MCH: 30 pg (ref 26.0–34.0)
MCHC: 34.3 g/dL (ref 30.0–36.0)
MCV: 87.5 fL (ref 80.0–100.0)
Monocytes Absolute: 0.8 10*3/uL (ref 0.1–1.0)
Monocytes Relative: 7 %
Neutro Abs: 8 10*3/uL — ABNORMAL HIGH (ref 1.7–7.7)
Neutrophils Relative %: 78 %
Platelets: 180 10*3/uL (ref 150–400)
RBC: 5.36 MIL/uL (ref 4.22–5.81)
RDW: 12.4 % (ref 11.5–15.5)
WBC: 10.3 10*3/uL (ref 4.0–10.5)
nRBC: 0 % (ref 0.0–0.2)

## 2019-07-18 LAB — MRSA PCR SCREENING: MRSA by PCR: NEGATIVE

## 2019-07-18 LAB — COMPREHENSIVE METABOLIC PANEL
ALT: 26 U/L (ref 0–44)
AST: 24 U/L (ref 15–41)
Albumin: 4.2 g/dL (ref 3.5–5.0)
Alkaline Phosphatase: 62 U/L (ref 38–126)
Anion gap: 8 (ref 5–15)
BUN: 14 mg/dL (ref 6–20)
CO2: 24 mmol/L (ref 22–32)
Calcium: 9.3 mg/dL (ref 8.9–10.3)
Chloride: 104 mmol/L (ref 98–111)
Creatinine, Ser: 0.99 mg/dL (ref 0.61–1.24)
GFR calc Af Amer: >60 mL/min (ref >60)
GFR calc non Af Amer: >60 mL/min (ref >60)
Glucose, Bld: 135 mg/dL — ABNORMAL HIGH (ref 70–99)
Potassium: 4.1 mmol/L (ref 3.5–5.1)
Sodium: 136 mmol/L (ref 135–145)
Total Bilirubin: 1.4 mg/dL — ABNORMAL HIGH (ref 0.3–1.2)
Total Protein: 7.5 g/dL (ref 6.5–8.1)

## 2019-07-18 LAB — LIPASE, BLOOD: Lipase: 35 U/L (ref 11–51)

## 2019-07-18 LAB — GLUCOSE, CAPILLARY: Glucose-Capillary: 126 mg/dL — ABNORMAL HIGH (ref 70–99)

## 2019-07-18 LAB — HEMOGLOBIN A1C
Hgb A1c MFr Bld: 6.2 % — ABNORMAL HIGH (ref 4.8–5.6)
Mean Plasma Glucose: 131.24 mg/dL

## 2019-07-18 MED ORDER — POLYETHYLENE GLYCOL 3350 17 G PO PACK: 17.0000 g | PACK | Freq: Every day | ORAL | Status: DC | PRN

## 2019-07-18 MED ORDER — IOHEXOL 300 MG/ML  SOLN
100.0000 mL | Freq: Once | INTRAMUSCULAR | Status: AC | PRN
  Administered 2019-07-18: 100 mL via INTRAVENOUS

## 2019-07-18 MED ORDER — METRONIDAZOLE IN NACL 5-0.79 MG/ML-% IV SOLN
500.0000 mg | Freq: Once | INTRAVENOUS | Status: AC
  Administered 2019-07-18: 500 mg via INTRAVENOUS
  Filled 2019-07-18: qty 100

## 2019-07-18 MED ORDER — ONDANSETRON HCL 4 MG/2ML IJ SOLN: 4.0000 mg | Freq: Four times a day (QID) | INTRAMUSCULAR | Status: DC | PRN

## 2019-07-18 MED ORDER — METRONIDAZOLE IN NACL 5-0.79 MG/ML-% IV SOLN
500.0000 mg | Freq: Three times a day (TID) | INTRAVENOUS | Status: DC
  Administered 2019-07-18 – 2019-07-20 (×5): 500 mg via INTRAVENOUS
  Filled 2019-07-18 (×8): qty 100

## 2019-07-18 MED ORDER — LISINOPRIL 10 MG PO TABS
10.0000 mg | ORAL_TABLET | Freq: Every day | ORAL | Status: DC
  Administered 2019-07-19 – 2019-07-20 (×2): 10 mg via ORAL
  Filled 2019-07-18 (×2): qty 1

## 2019-07-18 MED ORDER — MORPHINE SULFATE (PF) 2 MG/ML IV SOLN: 2.0000 mg | INTRAVENOUS | Status: DC | PRN

## 2019-07-18 MED ORDER — ACETAMINOPHEN 650 MG RE SUPP: 650.0000 mg | Freq: Four times a day (QID) | RECTAL | Status: DC | PRN

## 2019-07-18 MED ORDER — ENOXAPARIN SODIUM 40 MG/0.4ML ~~LOC~~ SOLN: 40.0000 mg | SUBCUTANEOUS | Status: DC

## 2019-07-18 MED ORDER — INSULIN ASPART 100 UNIT/ML ~~LOC~~ SOLN: 0.0000 [IU] | Freq: Every day | SUBCUTANEOUS | Status: DC

## 2019-07-18 MED ORDER — SODIUM CHLORIDE 0.9 % IV SOLN
2.0000 g | Freq: Once | INTRAVENOUS | Status: AC
  Administered 2019-07-18: 2 g via INTRAVENOUS
  Filled 2019-07-18: qty 20

## 2019-07-18 MED ORDER — SODIUM CHLORIDE 0.9 % IV SOLN
INTRAVENOUS | Status: DC
  Administered 2019-07-18 – 2019-07-19 (×3): via INTRAVENOUS

## 2019-07-18 MED ORDER — OXYCODONE HCL 5 MG PO TABS
5.0000 mg | ORAL_TABLET | ORAL | Status: DC | PRN
  Administered 2019-07-18: 5 mg via ORAL
  Filled 2019-07-18: qty 1

## 2019-07-18 MED ORDER — TRAZODONE HCL 100 MG PO TABS
100.0000 mg | ORAL_TABLET | Freq: Every day | ORAL | Status: DC
  Administered 2019-07-18 – 2019-07-19 (×2): 100 mg via ORAL
  Filled 2019-07-18 (×2): qty 1

## 2019-07-18 MED ORDER — ONDANSETRON HCL 4 MG PO TABS: 4.0000 mg | ORAL_TABLET | Freq: Four times a day (QID) | ORAL | Status: DC | PRN

## 2019-07-18 MED ORDER — ACETAMINOPHEN 325 MG PO TABS: 650.0000 mg | ORAL_TABLET | Freq: Four times a day (QID) | ORAL | Status: DC | PRN

## 2019-07-18 MED ORDER — PRAVASTATIN SODIUM 20 MG PO TABS: 20.0000 mg | ORAL_TABLET | Freq: Every day | ORAL | Status: DC

## 2019-07-18 MED ORDER — INSULIN ASPART 100 UNIT/ML ~~LOC~~ SOLN: 0.0000 [IU] | Freq: Three times a day (TID) | SUBCUTANEOUS | Status: DC

## 2019-07-18 MED ORDER — CIPROFLOXACIN IN D5W 400 MG/200ML IV SOLN
400.0000 mg | Freq: Two times a day (BID) | INTRAVENOUS | Status: DC
  Administered 2019-07-18 – 2019-07-20 (×4): 400 mg via INTRAVENOUS
  Filled 2019-07-18 (×8): qty 200

## 2019-07-18 NOTE — ED Provider Notes (Signed)
[facility] Emergency Department Provider Note  ____________________________________________  Time seen: Approximately 9:45 AM  I have reviewed the triage vital signs and the nursing notes.   HISTORY  Chief Complaint Abdominal Pain    HPI Britt BottomRobert Tipping is a 53 y.o. male with a history of GERD hyperlipidemia hypertension diabetes and diverticulitis who comes the ED complaining of left lower quadrant abdominal pain since yesterday, worse with movement.  Had to leave his pants on but today because the pressure on his lower abdomen was uncomfortable.  Feels like prior episode of diverticulitis.  No vomiting diarrhea bloody stool fevers chills chest pain or shortness of breath.  No dizziness or syncope.  Gradual onset and worsening, now severe with movement, moderate intensity with rest.  Nonradiating.  Review of electronic medical record shows that he had an episode of diverticulitis in 2017.  On initial visit to the ED he was found to have a focal sigmoid diverticulitis which unfortunately perforated within a few days despite treatment.      Past Medical History:  Diagnosis Date  . Arthritis    hands  . Diabetes mellitus without complication (HCC)   . Diverticulitis   . GERD (gastroesophageal reflux disease)   . Hyperlipidemia   . Hypertension      Patient Active Problem List   Diagnosis Date Noted  . Diverticulitis of colon (without mention of hemorrhage)(562.11)   . Diverticulosis of large intestine without diverticulitis   . Diverticulitis of large intestine with perforation without bleeding   . Diverticulitis of colon with perforation 05/03/2016     Past Surgical History:  Procedure Laterality Date  . ACHILLES TENDON REPAIR    . APPENDECTOMY    . COLONOSCOPY WITH PROPOFOL N/A 09/12/2016   Procedure: COLONOSCOPY WITH PROPOFOL;  Surgeon: Midge Miniumarren Wohl, MD;  [facility];  Service: Endoscopy;  Laterality: N/A;  Diabetic     Prior  to Admission medications   Medication Sig Start Date End Date Taking? Authorizing Provider  ciprofloxacin (CIPRO) 500 MG tablet Take 1 tablet (500 mg total) by mouth 2 (two) times daily. 05/22/17   Sharyn CreamerQuale, Mark, MD  guaiFENesin-codeine 100-10 MG/5ML syrup Take 5 mLs by mouth 3 (three) times daily as needed for cough. 02/24/18   Nita SickleVeronese, [place], MD  HYDROcodone-acetaminophen (NORCO/VICODIN) 5-325 MG tablet Take 1 tablet by mouth every 6 (six) hours as needed for moderate pain. 05/22/17   Sharyn CreamerQuale, Mark, MD  lisinopril (PRINIVIL,ZESTRIL) 10 MG tablet Take 10 mg by mouth daily. 04/12/16   [provider]  lovastatin (MEVACOR) 10 MG tablet Take 10 mg by mouth every morning.  04/12/16   [provider]  metFORMIN (GLUCOPHAGE) 500 MG tablet Take 500 mg by mouth 2 (two) times daily. 04/12/16   [provider]  metroNIDAZOLE (FLAGYL) 500 MG tablet Take 1 tablet (500 mg total) by mouth 3 (three) times daily. 05/22/17   Sharyn CreamerQuale, Mark, MD  naproxen (NAPROSYN) 500 MG tablet Take 1 tablet (500 mg total) by mouth 2 (two) times daily with a meal. 01/27/19   Jene EveryKinner, [place], MD  ondansetron (ZOFRAN ODT) 4 MG disintegrating tablet Take 1 tablet (4 mg total) by mouth every 8 (eight) hours as needed for nausea or vomiting. 02/24/18   Don PerkingVeronese, [place], MD  predniSONE (DELTASONE) 50 MG tablet Take 1 tablet (50 mg total) by mouth daily with breakfast. 01/27/19   Jene EveryKinner, [place], MD  promethazine (PHENERGAN) 12.5 MG tablet Take 1 tablet (12.5 mg total) by mouth every 6 (six) hours  as needed for nausea or vomiting. Patient not taking: Reported on 09/05/2016 05/18/16   Jennye MoccasinQuigley, Brian S, MD     Allergies Patient has no known allergies.   Family History  Problem Relation Age of Onset  . Epilepsy Mother   . Cancer Paternal Aunt        lung    Social History Social History   Tobacco Use  . Smoking status: Never Smoker  . Smokeless tobacco: Never Used  Substance Use Topics  . Alcohol use: Yes     Alcohol/week: 7.0 standard drinks    Types: 7 Cans of beer per week    Comment: ocass  . Drug use: No    Review of Systems  Constitutional:   No fever or chills.  ENT:   No sore throat. No rhinorrhea. Cardiovascular:   No chest pain or syncope. Respiratory:   No dyspnea or cough. Gastrointestinal:   Positive left lower quadrant abdominal pain as above without vomiting and diarrhea.  Musculoskeletal:   Negative for focal pain or swelling All other systems reviewed and are negative except as documented above in ROS and HPI.  ____________________________________________   PHYSICAL EXAM:  VITAL SIGNS: ED Triage Vitals  Enc Vitals Group     BP 07/18/19 0822 (!) 167/87     Pulse Rate 07/18/19 0822 92     Resp 07/18/19 0822 17     Temp 07/18/19 0822 98.2 F (36.8 C)     Temp Source 07/18/19 0822 Oral     SpO2 07/18/19 0822 98 %     Weight 07/18/19 0823 165 lb (74.8 kg)     Height 07/18/19 0823 5\' 5"  (1.651 m)     Head Circumference --      Peak Flow --      Pain Score 07/18/19 0823 4     Pain Loc --      Pain Edu? --      Excl. in GC? --     Vital signs reviewed, nursing assessments reviewed.   Constitutional:   Alert and oriented. Non-toxic appearance. Eyes:   Conjunctivae are normal. EOMI. PERRL. ENT      Head:   Normocephalic and atraumatic.      Nose:   No congestion/rhinnorhea.       Mouth/Throat:   MMM, no pharyngeal erythema. No peritonsillar mass.       Neck:   No meningismus. Full ROM. Hematological/Lymphatic/Immunilogical:   No cervical lymphadenopathy. Cardiovascular:   RRR. Symmetric bilateral radial and DP pulses.  No murmurs. Cap refill less than 2 seconds. Respiratory:   Normal respiratory effort without tachypnea/retractions. Breath sounds are clear and equal bilaterally. No wheezes/rales/rhonchi. Gastrointestinal:   Soft with left lower quadrant tenderness and guarding.  No rebound or rigidity.. Non distended. There is no CVA tenderness.   Musculoskeletal:   Normal range of motion in all extremities. No joint effusions.  No lower extremity tenderness.  No edema. Neurologic:   Normal speech and language.  Motor grossly intact. No acute focal neurologic deficits are appreciated.  Skin:    Skin is warm, dry and intact. No rash noted.  No petechiae, purpura, or bullae.  ____________________________________________    LABS (pertinent positives/negatives) (all labs ordered are listed, but only abnormal results are displayed) Labs Reviewed  COMPREHENSIVE METABOLIC PANEL - Abnormal; Notable for the following components:      Result Value   Glucose, Bld 135 (*)    Total Bilirubin 1.4 (*)    All other components  within normal limits  CBC WITH DIFFERENTIAL/PLATELET - Abnormal; Notable for the following components:   Neutro Abs 8.0 (*)    All other components within normal limits  SARS CORONAVIRUS 2  LIPASE, BLOOD   ____________________________________________   EKG    ____________________________________________    RADIOLOGY  Ct Abdomen Pelvis W Contrast  Result Date: 07/18/2019 CLINICAL DATA:  Lower abdominal pain since yesterday, history of diverticulitis, appendectomy, diabetes mellitus, hypertension EXAM: CT ABDOMEN AND PELVIS WITH CONTRAST TECHNIQUE: Multidetector CT imaging of the abdomen and pelvis was performed using the standard protocol following bolus administration of intravenous contrast. Sagittal and coronal MPR images reconstructed from axial data set. CONTRAST:  114mL OMNIPAQUE IOHEXOL 300 MG/ML SOLN IV. No oral contrast. 5 COMPARISON:  05/22/2017 FINDINGS: Lower chest: Lung bases clear Hepatobiliary: Gallbladder and liver normal appearance. No biliary dilatation. Pancreas: Normal appearance Spleen: Normal appearance Adrenals/Urinary Tract: Adrenal glands, kidneys, ureters, and bladder normal appearance Stomach/Bowel: Appendix surgically absent. Diverticulosis of descending and sigmoid colon. Mild pericolic  infiltrative changes within mesocolon adjacent to proximal sigmoid colon question subtle diverticulitis. No extraluminal gas or abscess. Stomach and remaining bowel loops unremarkable. Vascular/Lymphatic: Vascular structures patent. Atherosclerotic calcifications aorta and iliac arteries without aneurysm. Reproductive: Prosthetic enlargement. Seminal vesicles unremarkable. Other: Small BILATERAL inguinal hernias containing fat. No free air or free fluid. Musculoskeletal: Osseous structures unremarkable. IMPRESSION: Diverticulosis of descending and sigmoid colon with subtle infiltrative changes within proximal sigmoid mesocolon, question subtle diverticulitis. No evidence of perforation or abscess. Small BILATERAL inguinal hernias containing fat. Prostatic enlargement. Electronically Signed   By: Lavonia Dana M.D.   On: 07/18/2019 09:53    ____________________________________________   PROCEDURES Procedures  ____________________________________________  DIFFERENTIAL DIAGNOSIS   Diverticulitis, colon perforation, intra-abdominal abscess, bowel obstruction.  Doubt mesenteric ischemia AAA or dissection  CLINICAL IMPRESSION / ASSESSMENT AND PLAN / ED COURSE  Medications ordered in the ED: Medications  cefTRIAXone (ROCEPHIN) 2 g in sodium chloride 0.9 % 100 mL IVPB (2 g Intravenous New Bag/Given 07/18/19 1422)  metroNIDAZOLE (FLAGYL) IVPB 500 mg (has no administration in time range)  iohexol (OMNIPAQUE) 300 MG/ML solution 100 mL (100 mLs Intravenous Contrast Given 07/18/19 0935)    Pertinent labs & imaging results that were available during my care of the patient were reviewed by me and considered in my medical decision making (see chart for details).  Linkyn Gobin was evaluated in Emergency Department on 07/18/2019 for the symptoms described in the history of present illness. He was evaluated in the context of the global COVID-19 pandemic, which necessitated consideration that the patient might be at  risk for infection with the SARS-CoV-2 virus that causes COVID-19. Institutional protocols and algorithms that pertain to the evaluation of patients at risk for COVID-19 are in a state of rapid change based on information released by regulatory bodies including the CDC and federal and state organizations. These policies and algorithms were followed during the patient's care in the ED.   Patient presents with left lower quadrant abdominal pain and tenderness.  Vital signs unremarkable.  Exam is concerning for complicated diverticulitis primarily, so I will proceed with a CT scan.  Labs are unremarkable.  Offered analgesia which patient declines at this time.  ----------------------------------------- 2:51 PM on 07/18/2019 -----------------------------------------  CT is consistent with sigmoid diverticulitis.  It again appears uncomplicated, but due to the patient's significant pain and tenderness with guarding and his previous course of diverticulitis in the setting of his comorbidities, recommended hospitalization which the patient agrees.  Ordered ceftriaxone and  Flagyl IV.  Discussed with hospitalist for further management.      ____________________________________________   FINAL CLINICAL IMPRESSION(S) / ED DIAGNOSES    Final diagnoses:  Sigmoid diverticulitis  Left lower quadrant abdominal pain     ED Discharge Orders    None      Portions of this note were generated with dragon dictation software. Dictation errors may occur despite best attempts at proofreading.   Sharman CheekStafford, Dilpreet Faires, MD 07/18/19 1452

## 2019-07-18 NOTE — ED Notes (Signed)
Patient transported to CT 

## 2019-07-18 NOTE — H&P (Signed)
[facility] -  at [facility]   PATIENT NAME: Edward BottomRobert Quinn    MR#:  [id number]  DATE OF BIRTH:  08/09/1966  DATE OF ADMISSION:  07/18/2019  PRIMARY CARE PHYSICIAN: Thornton PapasSykes, Larry Justain, PA-C   REQUESTING/REFERRING PHYSICIAN: Ileana RoupJames McShane, MD  CHIEF COMPLAINT:   Chief Complaint  Patient presents with   Abdominal Pain    HISTORY OF PRESENT ILLNESS:  Edward Quinn  is a 53 y.o. male with a known history of diverticulosis, hypertension, hyperlipidemia, type 2 diabetes who presented to the ED with lower abdominal pain that started yesterday.  He states that the pain feels like a "intense gas pain".  His lower abdominal pain got worse this morning while he was at work, so he came to the ED.  His last bowel movement was this morning, and was on the soft side.  He denies any hematochezia or melena.  He denies any fevers, chills, nausea, vomiting.  He does have a history of diverticulitis with perforation about 3 years ago.  In the ED, he was mildly hypertensive.  Labs were unremarkable.  CT abdomen pelvis showed diverticulosis with likely diverticulitis in the proximal sigmoid mesocolon.  No perforation or abscess.  He was started on empiric antibiotics.  Hospitalists were called for admission.  PAST MEDICAL HISTORY:   Past Medical History:  Diagnosis Date   Arthritis    hands   Diabetes mellitus without complication (HCC)    Diverticulitis    GERD (gastroesophageal reflux disease)    Hyperlipidemia    Hypertension     PAST SURGICAL HISTORY:   Past Surgical History:  Procedure Laterality Date   ACHILLES TENDON REPAIR     APPENDECTOMY     COLONOSCOPY WITH PROPOFOL N/A 09/12/2016   Procedure: COLONOSCOPY WITH PROPOFOL;  Surgeon: Midge Miniumarren Wohl, MD;  [facility];  Service: Endoscopy;  Laterality: N/A;  Diabetic    SOCIAL HISTORY:   Social History   Tobacco Use   Smoking status: Never Smoker   Smokeless tobacco: Never Used    Substance Use Topics   Alcohol use: Yes    Alcohol/week: 7.0 standard drinks    Types: 7 Cans of beer per week    Comment: ocass    FAMILY HISTORY:   Family History  Problem Relation Age of Onset   Epilepsy Mother    Cancer Paternal Aunt        lung    DRUG ALLERGIES:  No Known Allergies  REVIEW OF SYSTEMS:   Review of Systems  Constitutional: Negative for chills and fever.  HENT: Negative for congestion and sore throat.   Eyes: Negative for blurred vision and double vision.  Respiratory: Negative for cough and shortness of breath.   Cardiovascular: Negative for chest pain and palpitations.  Gastrointestinal: Positive for abdominal pain and diarrhea. Negative for blood in stool, constipation, melena, nausea and vomiting.  Genitourinary: Negative for dysuria and urgency.  Musculoskeletal: Negative for back pain and neck pain.  Neurological: Negative for dizziness and headaches.  Psychiatric/Behavioral: Negative for depression. The patient is not nervous/anxious.     MEDICATIONS AT HOME:   Prior to Admission medications   Medication Sig Start Date End Date Taking? Authorizing Provider  ciprofloxacin (CIPRO) 500 MG tablet Take 1 tablet (500 mg total) by mouth 2 (two) times daily. 05/22/17   Sharyn CreamerQuale, Mark, MD  guaiFENesin-codeine 100-10 MG/5ML syrup Take 5 mLs by mouth 3 (three) times daily as needed for cough. 02/24/18   Nita SickleVeronese, [place], MD  HYDROcodone-acetaminophen (NORCO/VICODIN) 5-325 MG tablet Take 1 tablet by mouth every 6 (six) hours as needed for moderate pain. 05/22/17   Delman Kitten, MD  lisinopril (PRINIVIL,ZESTRIL) 10 MG tablet Take 10 mg by mouth daily. 04/12/16   [provider]  lovastatin (MEVACOR) 10 MG tablet Take 10 mg by mouth every morning.  04/12/16   [provider]  metFORMIN (GLUCOPHAGE) 500 MG tablet Take 500 mg by mouth 2 (two) times daily. 04/12/16   [provider]  metroNIDAZOLE (FLAGYL) 500 MG tablet Take 1 tablet (500  mg total) by mouth 3 (three) times daily. 05/22/17   Delman Kitten, MD  naproxen (NAPROSYN) 500 MG tablet Take 1 tablet (500 mg total) by mouth 2 (two) times daily with a meal. 01/27/19   Lavonia Drafts, MD  ondansetron (ZOFRAN ODT) 4 MG disintegrating tablet Take 1 tablet (4 mg total) by mouth every 8 (eight) hours as needed for nausea or vomiting. 02/24/18   Alfred Levins, [place], MD  predniSONE (DELTASONE) 50 MG tablet Take 1 tablet (50 mg total) by mouth daily with breakfast. 01/27/19   Lavonia Drafts, MD  promethazine (PHENERGAN) 12.5 MG tablet Take 1 tablet (12.5 mg total) by mouth every 6 (six) hours as needed for nausea or vomiting. Patient not taking: Reported on 09/05/2016 05/18/16   Daymon Larsen, MD      VITAL SIGNS:  Blood pressure 110/73, pulse 81, temperature 98.2 F (36.8 C), temperature source Oral, resp. rate 17, height 5\' 5"  (1.651 m), weight 74.8 kg, SpO2 98 %.  PHYSICAL EXAMINATION:  Physical Exam  GENERAL:  53 y.o.-year-old patient lying in the bed with no acute distress.  EYES: Pupils equal, round, reactive to light and accommodation. No scleral icterus. Extraocular muscles intact.  HEENT: Head atraumatic, normocephalic. Oropharynx and nasopharynx clear.  NECK:  Supple, no jugular venous distention. No thyroid enlargement, no tenderness.  LUNGS: Normal breath sounds bilaterally, no wheezing, rales,rhonchi or crepitation. No use of accessory muscles of respiration.  CARDIOVASCULAR: RRR, S1, S2 normal. No murmurs, rubs, or gallops.  ABDOMEN: Soft. Bowel sounds present. No organomegaly or mass. + Tenderness to palpation across the entire lower abdomen, no rebound or guarding. + Distention. EXTREMITIES: No pedal edema, cyanosis, or clubbing.  NEUROLOGIC: Cranial nerves II through XII are intact. Muscle strength 5/5 in all extremities. Sensation intact. Gait not checked.  PSYCHIATRIC: The patient is alert and oriented x 3.  SKIN: No obvious rash, lesion, or ulcer.   LABORATORY  PANEL:   CBC Recent Labs  Lab 07/18/19 0843  WBC 10.3  HGB 16.1  HCT 46.9  PLT 180   ------------------------------------------------------------------------------------------------------------------  Chemistries  Recent Labs  Lab 07/18/19 0843  NA 136  K 4.1  CL 104  CO2 24  GLUCOSE 135*  BUN 14  CREATININE 0.99  CALCIUM 9.3  AST 24  ALT 26  ALKPHOS 62  BILITOT 1.4*   ------------------------------------------------------------------------------------------------------------------  Cardiac Enzymes No results for input(s): TROPONINI in the last 168 hours. ------------------------------------------------------------------------------------------------------------------  RADIOLOGY:  Ct Abdomen Pelvis W Contrast  Result Date: 07/18/2019 CLINICAL DATA:  Lower abdominal pain since yesterday, history of diverticulitis, appendectomy, diabetes mellitus, hypertension EXAM: CT ABDOMEN AND PELVIS WITH CONTRAST TECHNIQUE: Multidetector CT imaging of the abdomen and pelvis was performed using the standard protocol following bolus administration of intravenous contrast. Sagittal and coronal MPR images reconstructed from axial data set. CONTRAST:  172mL OMNIPAQUE IOHEXOL 300 MG/ML SOLN IV. No oral contrast. 5 COMPARISON:  05/22/2017 FINDINGS: Lower chest: Lung bases clear Hepatobiliary: Gallbladder  and liver normal appearance. No biliary dilatation. Pancreas: Normal appearance Spleen: Normal appearance Adrenals/Urinary Tract: Adrenal glands, kidneys, ureters, and bladder normal appearance Stomach/Bowel: Appendix surgically absent. Diverticulosis of descending and sigmoid colon. Mild pericolic infiltrative changes within mesocolon adjacent to proximal sigmoid colon question subtle diverticulitis. No extraluminal gas or abscess. Stomach and remaining bowel loops unremarkable. Vascular/Lymphatic: Vascular structures patent. Atherosclerotic calcifications aorta and iliac arteries without  aneurysm. Reproductive: Prosthetic enlargement. Seminal vesicles unremarkable. Other: Small BILATERAL inguinal hernias containing fat. No free air or free fluid. Musculoskeletal: Osseous structures unremarkable. IMPRESSION: Diverticulosis of descending and sigmoid colon with subtle infiltrative changes within proximal sigmoid mesocolon, question subtle diverticulitis. No evidence of perforation or abscess. Small BILATERAL inguinal hernias containing fat. Prostatic enlargement. Electronically Signed   By: Ulyses SouthwardMark  Boles M.D.   On: 07/18/2019 09:53      IMPRESSION AND PLAN:   Acute diverticulitis- patient has a history of diverticulitis with perforation in the past.  Not meeting sepsis criteria on admission. -Continue ciprofloxacin and Flagyl -Start clear liquid diet and advance as tolerated -Pain control -IV fluids -Serial abdominal exams  Hypertension-BP mildly elevated in the ED -Continue home lisinopril  Type 2 diabetes -SSI  Hyperlipidemia- stable -Continue home statin  All the records are reviewed and case discussed with ED provider. Management plans discussed with the patient, family and they are in agreement.  CODE STATUS: Full  TOTAL TIME TAKING CARE OF THIS PATIENT: 45 minutes.    Jinny BlossomKaty D Jaxxen Voong M.D on 07/18/2019 at 2:12 PM  Between 7am to 6pm - Pager [phone]- [phone]  After 6pm go to [email or web address] - password [email or web address] [facility]  [facility] [place] [facility]  Office  [phone]  CC: Primary care physician; Thornton PapasSykes, Larry Justain, PA-C   Note: This dictation was prepared with Dragon dictation along with smaller phrase technology. Any transcriptional errors that result from this process are unintentional.

## 2019-07-18 NOTE — Progress Notes (Addendum)
Dr. Brett Albino was informed about the patient indicating he is scare of needles and would not take insulin if needed. He indicated he wanted to continue to take oral metformin. Dr. Brett Albino indicated that she would keep the patient on insulin for now as metformin can cause GI symptoms. This was informed to the patient and he seemed to understand. If he refuses RN to notify MD as an [place].

## 2019-07-18 NOTE — ED Notes (Signed)
ED Provider at bedside. 

## 2019-07-18 NOTE — ED Triage Notes (Signed)
Pt c/o lower abd pain since yesterday, denies N/V/D. States he has a hx of diverticulitis and this feels the same

## 2019-07-18 NOTE — ED Notes (Signed)
ED TO INPATIENT HANDOFF REPORT  ED Nurse Name and Phone #: [place]jordan [phone]  S Name/Age/Gender Edward Quinn 53 y.o. male Room/Bed: ED02A/ED02A  Code Status   Code Status: Prior  Home/SNF/Other Home Patient oriented to: self, place, time and situation Is this baseline? Yes   Triage Complete: Triage complete  Chief Complaint abd pain  Triage Note Pt c/o lower abd pain since yesterday, denies N/V/D. States he has a hx of diverticulitis and this feels the same   Allergies No Known Allergies  Level of Care/Admitting Diagnosis ED Disposition    ED Disposition Condition Comment   Admit  [facility]ospital Area[facility] [[id number]]  Level of Care: Med-Surg [16]  Covid Evaluation: Asymptomatic Screening Protocol (No Symptoms)  Diagnosis: Diverticulitis [409811][[id number]]  Admitting Physician: Willadean CarolMAYO, KATY DODD [[id number]]  Attending Physician: Willadean CarolMAYO, KATY DODD [[id number]]  Estimated length of stay: past midnight tomorrow  Certification:: I certify this patient will need inpatient services for at least 2 midnights  PT Class (Do Not Modify): Inpatient [101]  PT Acc Code (Do Not Modify): Private [1]       B Medical/Surgery History Past Medical History:  Diagnosis Date  . Arthritis    hands  . Diabetes mellitus without complication (HCC)   . Diverticulitis   . GERD (gastroesophageal reflux disease)   . Hyperlipidemia   . Hypertension    Past Surgical History:  Procedure Laterality Date  . ACHILLES TENDON REPAIR    . APPENDECTOMY    . COLONOSCOPY WITH PROPOFOL N/A 09/12/2016   Procedure: COLONOSCOPY WITH PROPOFOL;  Surgeon: Midge Miniumarren Wohl, MD;  [facility];  Service: Endoscopy;  Laterality: N/A;  Diabetic     A IV Location/Drains/Wounds Patient Lines/Drains/Airways Status   Active Line/Drains/Airways    Name:   Placement date:   Placement time:   Site:   Days:   Peripheral IV 07/18/19 Left Forearm   07/18/19    0844    Forearm   less than 1   Incision  (Closed) 09/12/16 Rectum Other (Comment)   09/12/16    0822     1039          Intake/Output Last 24 hours No intake or output data in the 24 hours ending 07/18/19 1648  Labs/Imaging Results for orders placed or performed during the hospital encounter of 07/18/19 (from the past 48 hour(s))  Comprehensive metabolic panel     Status: Abnormal   Collection Time: 07/18/19  8:43 AM  Result Value Ref Range   Sodium 136 135 - 145 mmol/L   Potassium 4.1 3.5 - 5.1 mmol/L   Chloride 104 98 - 111 mmol/L   CO2 24 22 - 32 mmol/L   Glucose, Bld 135 (H) 70 - 99 mg/dL   BUN 14 6 - 20 mg/dL   Creatinine, Ser 6.570.99 0.61 - 1.24 mg/dL   Calcium 9.3 8.9 - 84.610.3 mg/dL   Total Protein 7.5 6.5 - 8.1 g/dL   Albumin 4.2 3.5 - 5.0 g/dL   AST 24 15 - 41 U/L   ALT 26 0 - 44 U/L   Alkaline Phosphatase 62 38 - 126 U/L   Total Bilirubin 1.4 (H) 0.3 - 1.2 mg/dL   GFR calc non Af Amer >60 >60 mL/min   GFR calc Af Amer >60 >60 mL/min   Anion gap 8 5 - 15    Comment: Performed at [facility], [address] [place], [place] [address]  Lipase, blood     Status: None  Collection Time: 07/18/19  8:43 AM  Result Value Ref Range   Lipase 35 11 - 51 U/L    Comment: Performed at [facility], [address] [place], [place] [address]  CBC with Differential     Status: Abnormal   Collection Time: 07/18/19  8:43 AM  Result Value Ref Range   WBC 10.3 4.0 - 10.5 K/uL   RBC 5.36 4.22 - 5.81 MIL/uL   Hemoglobin 16.1 13.0 - 17.0 g/dL   HCT 98.146.9 19.139.0 - 47.852.0 %   MCV 87.5 80.0 - 100.0 fL   MCH 30.0 26.0 - 34.0 pg   MCHC 34.3 30.0 - 36.0 g/dL   RDW 29.512.4 62.111.5 - 30.815.5 %   Platelets 180 150 - 400 K/uL   nRBC 0.0 0.0 - 0.2 %   Neutrophils Relative % 78 %   Neutro Abs 8.0 (H) 1.7 - 7.7 K/uL   Lymphocytes Relative 12 %   Lymphs Abs 1.2 0.7 - 4.0 K/uL   Monocytes Relative 7 %   Monocytes Absolute 0.8 0.1 - 1.0 K/uL   Eosinophils Relative 2 %   Eosinophils Absolute 0.2 0.0 - 0.5 K/uL   Basophils  Relative 1 %   Basophils Absolute 0.1 0.0 - 0.1 K/uL   Immature Granulocytes 0 %   Abs Immature Granulocytes 0.03 0.00 - 0.07 K/uL    Comment: Performed at [facility], [address] [place], [place] [address]   Ct Abdomen Pelvis W Contrast  Result Date: 07/18/2019 CLINICAL DATA:  Lower abdominal pain since yesterday, history of diverticulitis, appendectomy, diabetes mellitus, hypertension EXAM: CT ABDOMEN AND PELVIS WITH CONTRAST TECHNIQUE: Multidetector CT imaging of the abdomen and pelvis was performed using the standard protocol following bolus administration of intravenous contrast. Sagittal and coronal MPR images reconstructed from axial data set. CONTRAST:  100mL OMNIPAQUE IOHEXOL 300 MG/ML SOLN IV. No oral contrast. 5 COMPARISON:  05/22/2017 FINDINGS: Lower chest: Lung bases clear Hepatobiliary: Gallbladder and liver normal appearance. No biliary dilatation. Pancreas: Normal appearance Spleen: Normal appearance Adrenals/Urinary Tract: Adrenal glands, kidneys, ureters, and bladder normal appearance Stomach/Bowel: Appendix surgically absent. Diverticulosis of descending and sigmoid colon. Mild pericolic infiltrative changes within mesocolon adjacent to proximal sigmoid colon question subtle diverticulitis. No extraluminal gas or abscess. Stomach and remaining bowel loops unremarkable. Vascular/Lymphatic: Vascular structures patent. Atherosclerotic calcifications aorta and iliac arteries without aneurysm. Reproductive: Prosthetic enlargement. Seminal vesicles unremarkable. Other: Small BILATERAL inguinal hernias containing fat. No free air or free fluid. Musculoskeletal: Osseous structures unremarkable. IMPRESSION: Diverticulosis of descending and sigmoid colon with subtle infiltrative changes within proximal sigmoid mesocolon, question subtle diverticulitis. No evidence of perforation or abscess. Small BILATERAL inguinal hernias containing fat. Prostatic enlargement. Electronically Signed    By: Ulyses SouthwardMark  Boles M.D.   On: 07/18/2019 09:53    Pending Labs Unresulted Labs (From admission, onward)    Start     Ordered   07/18/19 1405  SARS CORONAVIRUS 2 Nasal Swab Aptima Multi Swab  (Asymptomatic/Tier 2 Patients Labs)  ONCE - STAT,   STAT    Question Answer Comment  Is this test for diagnosis or screening Screening   Symptomatic for COVID-19 as defined by CDC No   Hospitalized for COVID-19 No   Admitted to ICU for COVID-19 No   Previously tested for COVID-19 No   Resident in a congregate (group) care setting No   Employed in healthcare setting No      07/18/19 1404   Signed and Held  HIV antibody (Routine Testing)  Once,   R     Signed and Held   Signed and Occupational hygienist morning,   R     Signed and Held   Signed and Held  CBC  Tomorrow morning,   R     Signed and Held   Signed and Held  Hemoglobin A1c  Once,   R    Comments: To assess prior glycemic control    Signed and Held          Vitals/Pain Today's Vitals   07/18/19 0844 07/18/19 1330 07/18/19 1430 07/18/19 1530  BP:  110/73 132/86 (!) 129/99  Pulse:  81 80 81  Resp:      Temp:      TempSrc:      SpO2:  98% 94% 96%  Weight:      Height:      PainSc: 5        Isolation Precautions No active isolations  Medications Medications  metroNIDAZOLE (FLAGYL) IVPB 500 mg (500 mg Intravenous New Bag/Given 07/18/19 1621)  iohexol (OMNIPAQUE) 300 MG/ML solution 100 mL (100 mLs Intravenous Contrast Given 07/18/19 0935)  cefTRIAXone (ROCEPHIN) 2 g in sodium chloride 0.9 % 100 mL IVPB (0 g Intravenous Stopped 07/18/19 1619)    Mobility walks Low fall risk   Focused Assessments    R Recommendations: See Admitting Provider Note  Report given to:   Additional Notes:

## 2019-07-18 NOTE — ED Notes (Signed)
Pt family at bedside

## 2019-07-19 LAB — BASIC METABOLIC PANEL
Anion gap: 6 (ref 5–15)
BUN: 11 mg/dL (ref 6–20)
CO2: 27 mmol/L (ref 22–32)
Calcium: 8.2 mg/dL — ABNORMAL LOW (ref 8.9–10.3)
Chloride: 104 mmol/L (ref 98–111)
Creatinine, Ser: 0.91 mg/dL (ref 0.61–1.24)
GFR calc Af Amer: >60 mL/min (ref >60)
GFR calc non Af Amer: >60 mL/min (ref >60)
Glucose, Bld: 116 mg/dL — ABNORMAL HIGH (ref 70–99)
Potassium: 3.8 mmol/L (ref 3.5–5.1)
Sodium: 137 mmol/L (ref 135–145)

## 2019-07-19 LAB — CBC
HCT: 41.1 % (ref 39.0–52.0)
Hemoglobin: 14 g/dL (ref 13.0–17.0)
MCH: 30 pg (ref 26.0–34.0)
MCHC: 34.1 g/dL (ref 30.0–36.0)
MCV: 88.2 fL (ref 80.0–100.0)
Platelets: 180 10*3/uL (ref 150–400)
RBC: 4.66 MIL/uL (ref 4.22–5.81)
RDW: 12.5 % (ref 11.5–15.5)
WBC: 9.2 10*3/uL (ref 4.0–10.5)
nRBC: 0 % (ref 0.0–0.2)

## 2019-07-19 LAB — GLUCOSE, CAPILLARY
Glucose-Capillary: 112 mg/dL — ABNORMAL HIGH (ref 70–99)
Glucose-Capillary: 122 mg/dL — ABNORMAL HIGH (ref 70–99)
Glucose-Capillary: 148 mg/dL — ABNORMAL HIGH (ref 70–99)
Glucose-Capillary: 78 mg/dL (ref 70–99)
Glucose-Capillary: 156 mg/dL — ABNORMAL HIGH (ref 70–99)

## 2019-07-19 LAB — SARS CORONAVIRUS 2 (TAT 6-24 HRS): SARS Coronavirus 2: NEGATIVE

## 2019-07-19 MED ORDER — KETOROLAC TROMETHAMINE 15 MG/ML IJ SOLN
15.0000 mg | Freq: Four times a day (QID) | INTRAMUSCULAR | Status: DC | PRN
  Administered 2019-07-19 – 2019-07-20 (×3): 15 mg via INTRAVENOUS
  Filled 2019-07-19 (×3): qty 1

## 2019-07-19 MED ORDER — PRAVASTATIN SODIUM 20 MG PO TABS
20.0000 mg | ORAL_TABLET | Freq: Every day | ORAL | Status: DC
  Administered 2019-07-19 – 2019-07-20 (×2): 20 mg via ORAL
  Filled 2019-07-19 (×2): qty 1

## 2019-07-19 NOTE — Progress Notes (Signed)
[facility] - [place] at [facility]   PATIENT NAME: Edward BottomRobert Quinn    MR#:  [id number]  DATE OF BIRTH:  08/04/1966  SUBJECTIVE:  CHIEF COMPLAINT: Patient's abdominal pain is better than yesterday but still having some lower abdominal discomfort and very hungry and wants to eat.  Tolerating clear liquid diet.  Denies any nausea or vomiting.  REVIEW OF SYSTEMS:  CONSTITUTIONAL: No fever, fatigue or weakness.  EYES: No blurred or double vision.  EARS, NOSE, AND THROAT: No tinnitus or ear pain.  RESPIRATORY: No cough, shortness of breath, wheezing or hemoptysis.  CARDIOVASCULAR: No chest pain, orthopnea, edema.  GASTROINTESTINAL: No nausea, vomiting, diarrhea.  Reporting lower abdominal pain.  GENITOURINARY: No dysuria, hematuria.  ENDOCRINE: No polyuria, nocturia,  HEMATOLOGY: No anemia, easy bruising or bleeding SKIN: No rash or lesion. MUSCULOSKELETAL: No joint pain or arthritis.   NEUROLOGIC: No tingling, numbness, weakness.  PSYCHIATRY: No anxiety or depression.   DRUG ALLERGIES:  No Known Allergies  VITALS:  Blood pressure 114/80, pulse (!) 59, temperature 98.2 F (36.8 C), temperature source Oral, resp. rate 16, height 5\' 5"  (1.651 m), weight 74.8 kg, SpO2 97 %.  PHYSICAL EXAMINATION:  GENERAL:  53 y.o.-year-old patient lying in the bed with no acute distress.  EYES: Pupils equal, round, reactive to light and accommodation. No scleral icterus. Extraocular muscles intact.  HEENT: Head atraumatic, normocephalic. Oropharynx and nasopharynx clear.  NECK:  Supple, no jugular venous distention. No thyroid enlargement, no tenderness.  LUNGS: Normal breath sounds bilaterally, no wheezing, rales,rhonchi or crepitation. No use of accessory muscles of respiration.  CARDIOVASCULAR: S1, S2 normal. No murmurs, rubs, or gallops.  ABDOMEN: Soft, lower abdominal tenderness, no rebound tenderness, nondistended. Bowel sounds present.  EXTREMITIES: No pedal edema,  cyanosis, or clubbing.  NEUROLOGIC: Cranial nerves II through XII are intact. Muscle strength 5/5 in all extremities. Sensation intact. Gait not checked.  PSYCHIATRIC: The patient is alert and oriented x 3.  SKIN: No obvious rash, lesion, or ulcer.    LABORATORY PANEL:   CBC Recent Labs  Lab 07/19/19 0516  WBC 9.2  HGB 14.0  HCT 41.1  PLT 180   ------------------------------------------------------------------------------------------------------------------  Chemistries  Recent Labs  Lab 07/18/19 0843 07/19/19 0516  NA 136 137  K 4.1 3.8  CL 104 104  CO2 24 27  GLUCOSE 135* 116*  BUN 14 11  CREATININE 0.99 0.91  CALCIUM 9.3 8.2*  AST 24  --   ALT 26  --   ALKPHOS 62  --   BILITOT 1.4*  --    ------------------------------------------------------------------------------------------------------------------  Cardiac Enzymes No results for input(s): TROPONINI in the last 168 hours. ------------------------------------------------------------------------------------------------------------------  RADIOLOGY:  Ct Abdomen Pelvis W Contrast  Result Date: 07/18/2019 CLINICAL DATA:  Lower abdominal pain since yesterday, history of diverticulitis, appendectomy, diabetes mellitus, hypertension EXAM: CT ABDOMEN AND PELVIS WITH CONTRAST TECHNIQUE: Multidetector CT imaging of the abdomen and pelvis was performed using the standard protocol following bolus administration of intravenous contrast. Sagittal and coronal MPR images reconstructed from axial data set. CONTRAST:  100mL OMNIPAQUE IOHEXOL 300 MG/ML SOLN IV. No oral contrast. 5 COMPARISON:  05/22/2017 FINDINGS: Lower chest: Lung bases clear Hepatobiliary: Gallbladder and liver normal appearance. No biliary dilatation. Pancreas: Normal appearance Spleen: Normal appearance Adrenals/Urinary Tract: Adrenal glands, kidneys, ureters, and bladder normal appearance Stomach/Bowel: Appendix surgically absent. Diverticulosis of descending and  sigmoid colon. Mild pericolic infiltrative changes within mesocolon adjacent to proximal sigmoid colon question subtle diverticulitis. No extraluminal gas or abscess. Stomach  and remaining bowel loops unremarkable. Vascular/Lymphatic: Vascular structures patent. Atherosclerotic calcifications aorta and iliac arteries without aneurysm. Reproductive: Prosthetic enlargement. Seminal vesicles unremarkable. Other: Small BILATERAL inguinal hernias containing fat. No free air or free fluid. Musculoskeletal: Osseous structures unremarkable. IMPRESSION: Diverticulosis of descending and sigmoid colon with subtle infiltrative changes within proximal sigmoid mesocolon, question subtle diverticulitis. No evidence of perforation or abscess. Small BILATERAL inguinal hernias containing fat. Prostatic enlargement. Electronically Signed   By: Lavonia Dana M.D.   On: 07/18/2019 09:53    EKG:   Orders placed or performed during the hospital encounter of 05/22/17  . ED EKG  . ED EKG  . EKG 12-Lead  . EKG 12-Lead  . EKG 12-Lead  . EKG 12-Lead    ASSESSMENT AND PLAN:    Acute diverticulitis- patient has a history of diverticulitis with perforation in the past.  Not meeting sepsis criteria on admission. -Continue ciprofloxacin and Flagyl - advance as tolerated to soft diet -Pain control as needed -IV fluids -Serial abdominal exams -COVID negative  Hypertension-BP mildly elevated in the ED -Continue home lisinopril  Type 2 diabetes -SSI  Hyperlipidemia- stable -Continue home statin    All the records are reviewed and case discussed with Care Management/Social Workerr. Management plans discussed with the patient, family and they are in agreement.  CODE STATUS: FC  TOTAL TIME TAKING CARE OF THIS PATIENT: 36  minutes.   POSSIBLE D/C IN 2  DAYS, DEPENDING ON CLINICAL CONDITION.  Note: This dictation was prepared with Dragon dictation along with smaller phrase technology. Any transcriptional errors  that result from this process are unintentional.   Nicholes Mango M.D on 07/19/2019 at 3:59 PM  Between 7am to 6pm - Pager - [phone] After 6pm go to [email or web address] - password [email or web address] [facility]  Office  [phone]  CC: Primary care physician; Nathaneil Canary, PA-C

## 2019-07-20 LAB — GLUCOSE, CAPILLARY
Glucose-Capillary: 109 mg/dL — ABNORMAL HIGH (ref 70–99)
Glucose-Capillary: 73 mg/dL (ref 70–99)

## 2019-07-20 LAB — HIV ANTIBODY (ROUTINE TESTING W REFLEX): HIV Screen 4th Generation wRfx: NONREACTIVE

## 2019-07-20 MED ORDER — METRONIDAZOLE 500 MG PO TABS: 500.0000 mg | ORAL_TABLET | Freq: Three times a day (TID) | ORAL | 0 refills | Status: AC

## 2019-07-20 MED ORDER — TRAMADOL HCL 50 MG PO TABS: 50.0000 mg | ORAL_TABLET | Freq: Four times a day (QID) | ORAL | Status: DC | PRN

## 2019-07-20 MED ORDER — TRAMADOL HCL 50 MG PO TABS: 50.0000 mg | ORAL_TABLET | Freq: Four times a day (QID) | ORAL | 0 refills | Status: DC | PRN

## 2019-07-20 MED ORDER — ACETAMINOPHEN 325 MG PO TABS: 325.0000 mg | ORAL_TABLET | Freq: Four times a day (QID) | ORAL | Status: AC | PRN

## 2019-07-20 MED ORDER — CIPROFLOXACIN HCL 500 MG PO TABS: 500.0000 mg | ORAL_TABLET | Freq: Two times a day (BID) | ORAL | 0 refills | Status: AC

## 2019-07-20 MED ORDER — ONDANSETRON 4 MG PO TBDP: 4.0000 mg | ORAL_TABLET | Freq: Three times a day (TID) | ORAL | 0 refills | Status: DC | PRN

## 2019-07-20 MED ORDER — POLYETHYLENE GLYCOL 3350 17 G PO PACK: 17.0000 g | PACK | Freq: Every day | ORAL | 0 refills | Status: DC | PRN

## 2019-07-20 NOTE — Discharge Instructions (Signed)
Follow-up with primary care physician in 3 days °

## 2019-07-20 NOTE — Progress Notes (Addendum)
MD ordered patient to be discharged home.  Discharge instructions were reviewed with the patient and he voiced understanding.  Follow-up appointment was made.  Prescriptions given to the patient.  IV was removed with catheter intact.  All patients questions were answered.  Patient refused to go out in a wheelchair so he walked out with his fiance.

## 2019-07-20 NOTE — Discharge Summary (Signed)
[place] at [place] NAME: Chance Munter    MR#:  [id number]  DATE OF BIRTH:  [birth year]  DATE OF ADMISSION:  07/18/2019 ADMITTING PHYSICIAN: Sela Hua, MD  DATE OF DISCHARGE:  07/20/19   PRIMARY CARE PHYSICIAN: Nathaneil Canary, PA-C    ADMISSION DIAGNOSIS:  Sigmoid diverticulitis [K57.32] Left lower quadrant abdominal pain [R10.32]  DISCHARGE DIAGNOSIS:  Active Problems:   Diverticulitis   SECONDARY DIAGNOSIS:   Past Medical History:  Diagnosis Date  . Arthritis    hands  . Diabetes mellitus without complication ([place])   . Diverticulitis   . GERD (gastroesophageal reflux disease)   . Hyperlipidemia   . Hypertension     HOSPITAL COURSE:  Johney Perotti  is a 53 y.o. male with a known history of diverticulosis, hypertension, hyperlipidemia, type 2 diabetes who presented to the ED with lower abdominal pain that started yesterday.  He states that the pain feels like a "intense gas pain".  His lower abdominal pain got worse this morning while he was at work, so he came to the ED.  His last bowel movement was this morning, and was on the soft side.  He denies any hematochezia or melena.  He denies any fevers, chills, nausea, vomiting.  He does have a history of diverticulitis with perforation about 3 years ago.  In the ED, he was mildly hypertensive.  Labs were unremarkable.  CT abdomen pelvis showed diverticulosis with likely diverticulitis in the proximal sigmoid mesocolon.  No perforation or abscess.  He was started on empiric antibiotics.  Hospitalists were called for admission    Acute diverticulitis-patient has a history of diverticulitis with perforation in the past. Not meeting sepsis criteria on admission. -Clinically improving continue ciprofloxacin and Flagyl will change from IV to p.o. form - advanced diet as tolerated to soft diet.  Patient is tolerating soft diet and wants to go home -Pain control as  needed -IV fluids provided -Serial abdominal exams-clinically improving -COVID negative -Diverticular diet -Stop using Goody powders over-the-counter  Hypertension-BP mildly elevated in the ED -Continue home lisinopril  Type 2 diabetes -SSI  Hyperlipidemia-stable -Continue home statin  DISCHARGE CONDITIONS:   Fair   CONSULTS OBTAINED:     PROCEDURES  None   DRUG ALLERGIES:  No Known Allergies  DISCHARGE MEDICATIONS:   Allergies as of 07/20/2019   No Known Allergies     Medication List    STOP taking these medications   Goodys Extra Strength 500-325-65 MG Pack Generic drug: Aspirin-Acetaminophen-Caffeine   naproxen 500 MG tablet Commonly known as: Naprosyn   predniSONE 50 MG tablet Commonly known as: DELTASONE     TAKE these medications   acetaminophen 325 MG tablet Commonly known as: TYLENOL Take 1 tablet (325 mg total) by mouth every 6 (six) hours as needed for mild pain (or Fever >/= 101).   ciprofloxacin 500 MG tablet Commonly known as: Cipro Take 1 tablet (500 mg total) by mouth 2 (two) times daily for 7 days.   lisinopril 10 MG tablet Commonly known as: ZESTRIL Take 10 mg by mouth daily.   lovastatin 20 MG tablet Commonly known as: MEVACOR Take 20 mg by mouth daily.   metFORMIN 500 MG tablet Commonly known as: GLUCOPHAGE Take 500 mg by mouth daily.   metroNIDAZOLE 500 MG tablet Commonly known as: Flagyl Take 1 tablet (500 mg total) by mouth 3 (three) times daily for 7 days.   ondansetron 4 MG disintegrating tablet  Commonly known as: Zofran ODT Take 1 tablet (4 mg total) by mouth every 8 (eight) hours as needed for nausea or vomiting.   polyethylene glycol 17 g packet Commonly known as: MIRALAX / GLYCOLAX Take 17 g by mouth daily as needed for mild constipation.   traMADol 50 MG tablet Commonly known as: ULTRAM Take 1 tablet (50 mg total) by mouth every 6 (six) hours as needed for moderate pain.        DISCHARGE  INSTRUCTIONS:   Follow-up with primary care physician in 3 days  DIET:  Diabetic diet  DISCHARGE CONDITION:  Fair  ACTIVITY:  Activity as tolerated  OXYGEN:  Home Oxygen: No.   Oxygen Delivery: room air  DISCHARGE LOCATION:  home   If you experience worsening of your admission symptoms, develop shortness of breath, life threatening emergency, suicidal or homicidal thoughts you must seek medical attention immediately by calling 911 or calling your MD immediately  if symptoms less severe.  You Must read complete instructions/literature along with all the possible adverse reactions/side effects for all the Medicines you take and that have been prescribed to you. Take any new Medicines after you have completely understood and accpet all the possible adverse reactions/side effects.   Please note  You were cared for by a hospitalist during your hospital stay. If you have any questions about your discharge medications or the care you received while you were in the hospital after you are discharged, you can call the unit and asked to speak with the hospitalist on call if the hospitalist that took care of you is not available. Once you are discharged, your primary care physician will handle any further medical issues. Please note that NO REFILLS for any discharge medications will be authorized once you are discharged, as it is imperative that you return to your primary care physician (or establish a relationship with a primary care physician if you do not have one) for your aftercare needs so that they can reassess your need for medications and monitor your lab values.     Today  Chief Complaint  Patient presents with  . Abdominal Pain   Patient is feeling much better and wants to go home.  Tolerating advanced diet.  Denies any nausea vomiting.  Some abdominal discomfort is better but significantly improved  ROS:  CONSTITUTIONAL: Denies fevers, chills. Denies any fatigue, weakness.   EYES: Denies blurry vision, double vision, eye pain. EARS, NOSE, THROAT: Denies tinnitus, ear pain, hearing loss. RESPIRATORY: Denies cough, wheeze, shortness of breath.  CARDIOVASCULAR: Denies chest pain, palpitations, edema.  GASTROINTESTINAL: Denies nausea, vomiting, diarrhea, abdominal pain. Denies bright red blood per rectum. GENITOURINARY: Denies dysuria, hematuria. ENDOCRINE: Denies nocturia or thyroid problems. HEMATOLOGIC AND LYMPHATIC: Denies easy bruising or bleeding. SKIN: Denies rash or lesion. MUSCULOSKELETAL: Denies pain in neck, back, shoulder, knees, hips or arthritic symptoms.  NEUROLOGIC: Denies paralysis, paresthesias.  PSYCHIATRIC: Denies anxiety or depressive symptoms.   VITAL SIGNS:  Blood pressure (!) 136/93, pulse (!) 57, temperature 98.2 F (36.8 C), temperature source Oral, resp. rate 16, height 5\' 5"  (1.651 m), weight 74.8 kg, SpO2 98 %.  I/O:    Intake/Output Summary (Last 24 hours) at 07/20/2019 1437 Last data filed at 07/20/2019 1015 Gross per 24 hour  Intake 1923.47 ml  Output 0 ml  Net 1923.47 ml    PHYSICAL EXAMINATION:  GENERAL:  53 y.o.-year-old patient lying in the bed with no acute distress.  EYES: Pupils equal, round, reactive to light and accommodation.  No scleral icterus. Extraocular muscles intact.  HEENT: Head atraumatic, normocephalic. Oropharynx and nasopharynx clear.  NECK:  Supple, no jugular venous distention. No thyroid enlargement, no tenderness.  LUNGS: Normal breath sounds bilaterally, no wheezing, rales,rhonchi or crepitation. No use of accessory muscles of respiration.  CARDIOVASCULAR: S1, S2 normal. No murmurs, rubs, or gallops.  ABDOMEN: Soft, non-tender, non-distended. Bowel sounds present.  EXTREMITIES: No pedal edema, cyanosis, or clubbing.  NEUROLOGIC: Cranial nerves II through XII are intact. Muscle strength 5/5 in all extremities. Sensation intact. Gait not checked.  PSYCHIATRIC: The patient is alert and oriented x  3.  SKIN: No obvious rash, lesion, or ulcer.   DATA REVIEW:   CBC Recent Labs  Lab 07/19/19 0516  WBC 9.2  HGB 14.0  HCT 41.1  PLT 180    Chemistries  Recent Labs  Lab 07/18/19 0843 07/19/19 0516  NA 136 137  K 4.1 3.8  CL 104 104  CO2 24 27  GLUCOSE 135* 116*  BUN 14 11  CREATININE 0.99 0.91  CALCIUM 9.3 8.2*  AST 24  --   ALT 26  --   ALKPHOS 62  --   BILITOT 1.4*  --     Cardiac Enzymes No results for input(s): TROPONINI in the last 168 hours.  Microbiology Results  Results for orders placed or performed during the hospital encounter of 07/18/19  SARS CORONAVIRUS 2 Nasal Swab Aptima Multi Swab     Status: None   Collection Time: 07/18/19  2:05 PM   Specimen: Aptima Multi Swab; Nasal Swab  Result Value Ref Range Status   SARS Coronavirus 2 NEGATIVE NEGATIVE Final    Comment: (NOTE) SARS-CoV-2 target nucleic acids are NOT DETECTED. The SARS-CoV-2 RNA is generally detectable in upper and lower respiratory specimens during the acute phase of infection. Negative results do not preclude SARS-CoV-2 infection, do not rule out co-infections with other pathogens, and should not be used as the sole basis for treatment or other patient management decisions. Negative results must be combined with clinical observations, patient history, and epidemiological information. The expected result is Negative. Fact Sheet for Patients: HairSlick.nohttps://www.fda.gov/media/138098/download Fact Sheet for Healthcare Providers: quierodirigir.comhttps://www.fda.gov/media/138095/download This test is not yet approved or cleared by the [place] FDA and  has been authorized for detection and/or diagnosis of SARS-CoV-2 by FDA under an Emergency Use Authorization (EUA). This EUA will remain  in effect (meaning this test can be used) for the duration of the COVID-19 declaration under Section 56 4(b)(1) of the Act, 21 U.S.C. section 360bbb-3(b)(1), unless the authorization is terminated or revoked  sooner. Performed at [facility], [address] [place], [place] [address]   MRSA PCR Screening     Status: None   Collection Time: 07/18/19  6:53 PM   Specimen: Nasopharyngeal  Result Value Ref Range Status   MRSA by PCR NEGATIVE NEGATIVE Final    Comment:        The GeneXpert MRSA Assay (FDA approved for NASAL specimens only), is one component of a comprehensive MRSA colonization surveillance program. It is not intended to diagnose MRSA infection nor to guide or monitor treatment for MRSA infections. Performed at [facility], [address] [place], [place] [address]     RADIOLOGY:  Ct Abdomen Pelvis W Contrast  Result Date: 07/18/2019 CLINICAL DATA:  Lower abdominal pain since yesterday, history of diverticulitis, appendectomy, diabetes mellitus, hypertension EXAM: CT ABDOMEN AND PELVIS WITH CONTRAST TECHNIQUE: Multidetector CT imaging of the abdomen and pelvis was  performed using the standard protocol following bolus administration of intravenous contrast. Sagittal and coronal MPR images reconstructed from axial data set. CONTRAST:  100mL OMNIPAQUE IOHEXOL 300 MG/ML SOLN IV. No oral contrast. 5 COMPARISON:  05/22/2017 FINDINGS: Lower chest: Lung bases clear Hepatobiliary: Gallbladder and liver normal appearance. No biliary dilatation. Pancreas: Normal appearance Spleen: Normal appearance Adrenals/Urinary Tract: Adrenal glands, kidneys, ureters, and bladder normal appearance Stomach/Bowel: Appendix surgically absent. Diverticulosis of descending and sigmoid colon. Mild pericolic infiltrative changes within mesocolon adjacent to proximal sigmoid colon question subtle diverticulitis. No extraluminal gas or abscess. Stomach and remaining bowel loops unremarkable. Vascular/Lymphatic: Vascular structures patent. Atherosclerotic calcifications aorta and iliac arteries without aneurysm. Reproductive: Prosthetic enlargement. Seminal vesicles unremarkable. Other: Small  BILATERAL inguinal hernias containing fat. No free air or free fluid. Musculoskeletal: Osseous structures unremarkable. IMPRESSION: Diverticulosis of descending and sigmoid colon with subtle infiltrative changes within proximal sigmoid mesocolon, question subtle diverticulitis. No evidence of perforation or abscess. Small BILATERAL inguinal hernias containing fat. Prostatic enlargement. Electronically Signed   By: Ulyses SouthwardMark  Boles M.D.   On: 07/18/2019 09:53    EKG:   Orders placed or performed during the hospital encounter of 05/22/17  . ED EKG  . ED EKG  . EKG 12-Lead  . EKG 12-Lead  . EKG 12-Lead  . EKG 12-Lead      Management plans discussed with the patient, family and they are in agreement.  CODE STATUS:     Code Status Orders  (From admission, onward)         Start     Ordered   07/18/19 1731  Full code  Continuous     07/18/19 1730        Code Status History    Date Active Date Inactive Code Status Order ID Comments User Context   05/03/2016 1934 05/05/2016 1635 Full Code [id number]  Leafy RoPabon, Diego F, MD ED   Advance Care Planning Activity      TOTAL TIME TAKING CARE OF THIS PATIENT: 43 minutes.   Note: This dictation was prepared with Dragon dictation along with smaller phrase technology. Any transcriptional errors that result from this process are unintentional.   @[facility] @  on 07/20/2019 at 2:37 PM  Between 7am to 6pm - Pager - [phone]  After 6pm go to [email or web address] - password [email or web address]  Office  [phone]  CC: Primary care physician; Thornton PapasSykes, Larry Justain, PA-C

## 2019-08-15 ENCOUNTER — Emergency Department
Admission: EM | Admit: 2019-08-15 | Discharge: 2019-08-15 | Disposition: A | Discharge status: Home / Self Care | Payer: Commercial Managed Care - PPO | Attending: Emergency Medicine | Admitting: Emergency Medicine

## 2019-08-15 ENCOUNTER — Other Ambulatory Visit: Payer: Self-pay

## 2019-08-15 ENCOUNTER — Emergency Department: Payer: Commercial Managed Care - PPO

## 2019-08-15 ENCOUNTER — Encounter: Payer: Self-pay | Admitting: Emergency Medicine

## 2019-08-15 DIAGNOSIS — K5732 Diverticulitis of large intestine without perforation or abscess without bleeding: Secondary | ICD-10-CM | POA: Diagnosis not present

## 2019-08-15 DIAGNOSIS — I10 Essential (primary) hypertension: Secondary | ICD-10-CM | POA: Diagnosis not present

## 2019-08-15 DIAGNOSIS — E119 Type 2 diabetes mellitus without complications: Secondary | ICD-10-CM | POA: Diagnosis not present

## 2019-08-15 DIAGNOSIS — R1030 Lower abdominal pain, unspecified: Secondary | ICD-10-CM | POA: Diagnosis present

## 2019-08-15 DIAGNOSIS — Z79899 Other long term (current) drug therapy: Secondary | ICD-10-CM | POA: Insufficient documentation

## 2019-08-15 DIAGNOSIS — K5792 Diverticulitis of intestine, part unspecified, without perforation or abscess without bleeding: Secondary | ICD-10-CM

## 2019-08-15 DIAGNOSIS — Z7984 Long term (current) use of oral hypoglycemic drugs: Secondary | ICD-10-CM | POA: Insufficient documentation

## 2019-08-15 NOTE — ED Provider Notes (Signed)
[facility] Emergency Department Provider Note   ____________________________________________    I have reviewed the triage vital signs and the nursing notes.   HISTORY  Chief Complaint Abdominal Pain and Dizziness     HPI Edward Quinn is a 53 y.o. male with a history of diabetes, who was recently hospitalized for diverticulitis but has been doing well after discharge and has finished his antibiotics.  He notes that he has had resurgence of discomfort in the lower abdomen over the last 1 to 2 days it has been somewhat intermittent.  Denies fevers or chills.  Has had a couple episodes of feeling dizzy.  No nausea vomiting or diarrhea.  Has not taken anything for this.  Review of medical records demonstrates admission for diverticulitis  Past Medical History:  Diagnosis Date  . Arthritis    hands  . Diabetes mellitus without complication ([place])   . Diverticulitis   . GERD (gastroesophageal reflux disease)   . Hyperlipidemia   . Hypertension     Patient Active Problem List   Diagnosis Date Noted  . Diverticulitis 07/18/2019  . Diverticulitis of colon (without mention of hemorrhage)(562.11)   . Diverticulosis of large intestine without diverticulitis   . Diverticulitis of large intestine with perforation without bleeding   . Diverticulitis of colon with perforation 05/03/2016    Past Surgical History:  Procedure Laterality Date  . ACHILLES TENDON REPAIR    . APPENDECTOMY    . COLONOSCOPY WITH PROPOFOL N/A 09/12/2016   Procedure: COLONOSCOPY WITH PROPOFOL;  Surgeon: Lucilla Lame, MD;  Location: [place];  Service: Endoscopy;  Laterality: N/A;  Diabetic    Prior to Admission medications   Medication Sig Start Date End Date Taking? Authorizing Provider  acetaminophen (TYLENOL) 325 MG tablet Take 1 tablet (325 mg total) by mouth every 6 (six) hours as needed for mild pain (or Fever >/= 101). 07/20/19   Gouru, Illene Silver, MD  lisinopril  (PRINIVIL,ZESTRIL) 10 MG tablet Take 10 mg by mouth daily. 04/12/16   [provider]  lovastatin (MEVACOR) 20 MG tablet Take 20 mg by mouth daily.     [provider]  metFORMIN (GLUCOPHAGE) 500 MG tablet Take 500 mg by mouth daily.     [provider]  ondansetron (ZOFRAN ODT) 4 MG disintegrating tablet Take 1 tablet (4 mg total) by mouth every 8 (eight) hours as needed for nausea or vomiting. 07/20/19   Gouru, Illene Silver, MD  polyethylene glycol (MIRALAX / GLYCOLAX) 17 g packet Take 17 g by mouth daily as needed for mild constipation. 07/20/19   Gouru, Illene Silver, MD  traMADol (ULTRAM) 50 MG tablet Take 1 tablet (50 mg total) by mouth every 6 (six) hours as needed for moderate pain. 07/20/19   Nicholes Mango, MD     Allergies Patient has no known allergies.  Family History  Problem Relation Age of Onset  . Epilepsy Mother   . Cancer Paternal Aunt        lung    Social History Social History   Tobacco Use  . Smoking status: Never Smoker  . Smokeless tobacco: Never Used  Substance Use Topics  . Alcohol use: Yes    Alcohol/week: 7.0 standard drinks    Types: 7 Cans of beer per week    Comment: ocass  . Drug use: No    Review of Systems  Constitutional: No fever/chills Eyes: No visual changes.  ENT: No sore throat. Cardiovascular: Denies chest pain. Respiratory: Denies shortness of  breath. Gastrointestinal: No abdominal pain.  No nausea, no vomiting.   Genitourinary: Negative for dysuria. Musculoskeletal: Negative for back pain. Skin: Negative for rash. Neurological: Negative for headaches or weakness   ____________________________________________   PHYSICAL EXAM:  VITAL SIGNS: ED Triage Vitals  Enc Vitals Group     BP 08/15/19 1816 (!) 144/97     Pulse Rate 08/15/19 1816 79     Resp 08/15/19 1816 16     Temp 08/15/19 1816 98.6 F (37 C)     Temp Source 08/15/19 1816 Oral     SpO2 08/15/19 1816 99 %     Weight 08/15/19 1817 73.5 kg (162 lb)      Height 08/15/19 1817 1.676 m (5\' 6" )     Head Circumference --      Peak Flow --      Pain Score 08/15/19 1816 0     Pain Loc --      Pain Edu? --      Excl. in [place]? --     Constitutional: Alert and oriented.  Nose: No congestion/rhinnorhea. Mouth/Throat: Mucous membranes are moist.    Cardiovascular: Normal rate, regular rhythm. Grossly normal heart sounds.  Good peripheral circulation. Respiratory: Normal respiratory effort.  No retractions. Lungs CTAB. Gastrointestinal: Soft and nontender. No distention.  No CVA tenderness.  Musculoskeletal: .  Warm and well perfused Neurologic:  Normal speech and language. No gross focal neurologic deficits are appreciated.  Skin:  Skin is warm, dry and intact. No rash noted. Psychiatric: Mood and affect are normal. Speech and behavior are normal.  ____________________________________________   LABS (all labs ordered are listed, but only abnormal results are displayed)  Labs Reviewed - No data to display ____________________________________________  EKG  None ____________________________________________  RADIOLOGY  CT of the pelvis without contrast shows improving inflammation ____________________________________________   PROCEDURES  Procedure(s) performed: No  Procedures   Critical Care performed: No ____________________________________________   INITIAL IMPRESSION / ASSESSMENT AND PLAN / ED COURSE  Pertinent labs & imaging results that were available during my care of the patient were reviewed by me and considered in my medical decision making (see chart for details).  Patient presents with resurgence of pain, concern for worsening diverticulitis or possible abscess formation exam is overall reassuring, lab work is done as outpatient is unremarkable, reviewed via care everywhere.  CT scan shows improving inflammation, patient is greatly reassured by this, follow-up with PCP.     ____________________________________________   FINAL CLINICAL IMPRESSION(S) / ED DIAGNOSES  Final diagnoses:  Diverticulitis        Note:  This document was prepared using Dragon voice recognition software and may include unintentional dictation errors.   Jene EveryKinner, [place], MD 08/15/19 2031

## 2019-08-15 NOTE — ED Triage Notes (Signed)
Pt c/o pain across lower abdomen.  Was here for same thing 2 weeks ago and felt fine when left but then the same pain started coming and going again.  Pt had CBC, CMP and UA at PCP in care everywhere today.  Pt came today because of nausea today. No vomiting or diarrhea. No fevers.  Ambulatory. Color WNL. VSS.    Discussed with dr Corky Downs, no repeat labs at this time.

## 2019-08-15 NOTE — ED Notes (Signed)
Dr. Corky Downs at the bedside.

## 2019-08-15 NOTE — ED Notes (Addendum)
Dr. Kinner at the bedside for pt evaluation.  

## 2019-08-15 NOTE — Discharge Instructions (Signed)
Your CT scan showed significant improvement

## 2019-10-16 ENCOUNTER — Emergency Department
Admission: EM | Admit: 2019-10-16 | Discharge: 2019-10-16 | Disposition: A | Discharge status: Home / Self Care | Payer: Commercial Managed Care - PPO | Attending: Emergency Medicine | Admitting: Emergency Medicine

## 2019-10-16 ENCOUNTER — Other Ambulatory Visit: Payer: Self-pay

## 2019-10-16 ENCOUNTER — Emergency Department: Payer: Commercial Managed Care - PPO

## 2019-10-16 ENCOUNTER — Encounter: Payer: Self-pay | Admitting: Emergency Medicine

## 2019-10-16 DIAGNOSIS — E119 Type 2 diabetes mellitus without complications: Secondary | ICD-10-CM | POA: Diagnosis not present

## 2019-10-16 DIAGNOSIS — Z79899 Other long term (current) drug therapy: Secondary | ICD-10-CM | POA: Diagnosis not present

## 2019-10-16 DIAGNOSIS — I10 Essential (primary) hypertension: Secondary | ICD-10-CM | POA: Diagnosis not present

## 2019-10-16 DIAGNOSIS — K5732 Diverticulitis of large intestine without perforation or abscess without bleeding: Secondary | ICD-10-CM | POA: Diagnosis not present

## 2019-10-16 DIAGNOSIS — R1032 Left lower quadrant pain: Secondary | ICD-10-CM | POA: Diagnosis present

## 2019-10-16 DIAGNOSIS — Z7984 Long term (current) use of oral hypoglycemic drugs: Secondary | ICD-10-CM | POA: Diagnosis not present

## 2019-10-16 LAB — URINALYSIS, COMPLETE (UACMP) WITH MICROSCOPIC
Bacteria, UA: NONE SEEN
Bilirubin Urine: NEGATIVE
Glucose, UA: NEGATIVE mg/dL
Hgb urine dipstick: NEGATIVE
Ketones, ur: NEGATIVE mg/dL
Leukocytes,Ua: NEGATIVE
Nitrite: NEGATIVE
Protein, ur: NEGATIVE mg/dL
Specific Gravity, Urine: 1.009 (ref 1.005–1.030)
pH: 5 (ref 5.0–8.0)

## 2019-10-16 LAB — CBC
HCT: 45 % (ref 39.0–52.0)
Hemoglobin: 15.4 g/dL (ref 13.0–17.0)
MCH: 29.1 pg (ref 26.0–34.0)
MCHC: 34.2 g/dL (ref 30.0–36.0)
MCV: 85.1 fL (ref 80.0–100.0)
Platelets: 182 10*3/uL (ref 150–400)
RBC: 5.29 MIL/uL (ref 4.22–5.81)
RDW: 12.2 % (ref 11.5–15.5)
WBC: 11.6 10*3/uL — ABNORMAL HIGH (ref 4.0–10.5)
nRBC: 0 % (ref 0.0–0.2)

## 2019-10-16 LAB — LIPASE, BLOOD: Lipase: 30 U/L (ref 11–51)

## 2019-10-16 LAB — COMPREHENSIVE METABOLIC PANEL
ALT: 22 U/L (ref 0–44)
AST: 22 U/L (ref 15–41)
Albumin: 4.4 g/dL (ref 3.5–5.0)
Alkaline Phosphatase: 68 U/L (ref 38–126)
Anion gap: 10 (ref 5–15)
BUN: 13 mg/dL (ref 6–20)
CO2: 25 mmol/L (ref 22–32)
Calcium: 9.1 mg/dL (ref 8.9–10.3)
Chloride: 101 mmol/L (ref 98–111)
Creatinine, Ser: 0.96 mg/dL (ref 0.61–1.24)
GFR calc Af Amer: >60 mL/min (ref >60)
GFR calc non Af Amer: >60 mL/min (ref >60)
Glucose, Bld: 165 mg/dL — ABNORMAL HIGH (ref 70–99)
Potassium: 4 mmol/L (ref 3.5–5.1)
Sodium: 136 mmol/L (ref 135–145)
Total Bilirubin: 1.8 mg/dL — ABNORMAL HIGH (ref 0.3–1.2)
Total Protein: 8 g/dL (ref 6.5–8.1)

## 2019-10-16 MED ORDER — IOHEXOL 300 MG/ML  SOLN
100.0000 mL | Freq: Once | INTRAMUSCULAR | Status: AC | PRN
  Administered 2019-10-16: 100 mL via INTRAVENOUS

## 2019-10-16 MED ORDER — AMOXICILLIN-POT CLAVULANATE 875-125 MG PO TABS: 1.0000 | ORAL_TABLET | Freq: Two times a day (BID) | ORAL | 0 refills | Status: AC

## 2019-10-16 MED ORDER — TRAMADOL HCL 50 MG PO TABS: 50.0000 mg | ORAL_TABLET | Freq: Four times a day (QID) | ORAL | 0 refills | Status: DC | PRN

## 2019-10-16 MED ORDER — SODIUM CHLORIDE 0.9% FLUSH: 3.0000 mL | Freq: Once | INTRAVENOUS | Status: DC

## 2019-10-16 MED ORDER — AMOXICILLIN-POT CLAVULANATE 875-125 MG PO TABS: 1.0000 | ORAL_TABLET | Freq: Two times a day (BID) | ORAL | 0 refills | Status: DC

## 2019-10-16 MED ORDER — AMOXICILLIN-POT CLAVULANATE 875-125 MG PO TABS
1.0000 | ORAL_TABLET | Freq: Once | ORAL | Status: AC
  Administered 2019-10-16: 1 via ORAL
  Filled 2019-10-16: qty 1

## 2019-10-16 MED ORDER — IOHEXOL 9 MG/ML PO SOLN
500.0000 mL | Freq: Two times a day (BID) | ORAL | Status: DC | PRN
  Administered 2019-10-16: 500 mL via ORAL
  Filled 2019-10-16: qty 500

## 2019-10-16 NOTE — ED Provider Notes (Signed)
[facility] Emergency Department Provider Note  Time seen: 7:40 AM  I have reviewed the triage vital signs and the nursing notes.   HISTORY  Chief Complaint Abdominal Pain   HPI Edward Quinn is a 53 y.o. male with a past medical history of diabetes, gastric reflux, hypertension, hyperlipidemia, presents to the emergency department for left lower quadrant abdominal pain.  According to the patient he has had 3 episodes of diverticulitis over the past 3 to 4 years that have resembled today's discomfort.  Patient states moderate left lower quadrant abdominal pain, dull, ongoing since yesterday morning.  No diarrhea.  No nausea or vomiting.  No fever no cough or shortness of breath.   Past Medical History:  Diagnosis Date  . Arthritis    hands  . Diabetes mellitus without complication ([facility])   . Diverticulitis   . GERD (gastroesophageal reflux disease)   . Hyperlipidemia   . Hypertension     Patient Active Problem List   Diagnosis Date Noted  . Diverticulitis 07/18/2019  . Diverticulitis of colon (without mention of hemorrhage)(562.11)   . Diverticulosis of large intestine without diverticulitis   . Diverticulitis of large intestine with perforation without bleeding   . Diverticulitis of colon with perforation 05/03/2016    Past Surgical History:  Procedure Laterality Date  . ACHILLES TENDON REPAIR    . APPENDECTOMY    . COLONOSCOPY WITH PROPOFOL N/A 09/12/2016   Procedure: COLONOSCOPY WITH PROPOFOL;  Surgeon: Midge Minium, MD;  Location: [facility];  Service: Endoscopy;  Laterality: N/A;  Diabetic    Prior to Admission medications   Medication Sig Start Date End Date Taking? Authorizing Provider  acetaminophen (TYLENOL) 325 MG tablet Take 1 tablet (325 mg total) by mouth every 6 (six) hours as needed for mild pain (or Fever >/= 101). 07/20/19  Yes Gouru, Deanna Artis, MD  lisinopril (PRINIVIL,ZESTRIL) 10 MG tablet Take 10 mg by mouth daily. 04/12/16  Yes  [provider]  lovastatin (MEVACOR) 20 MG tablet Take 20 mg by mouth daily.    Yes [provider]  metFORMIN (GLUCOPHAGE) 500 MG tablet Take 500 mg by mouth daily.    Yes [provider]  ondansetron (ZOFRAN ODT) 4 MG disintegrating tablet Take 1 tablet (4 mg total) by mouth every 8 (eight) hours as needed for nausea or vomiting. 07/20/19  Yes Gouru, Aruna, MD  polyethylene glycol (MIRALAX / GLYCOLAX) 17 g packet Take 17 g by mouth daily as needed for mild constipation. 07/20/19  Yes Gouru, Deanna Artis, MD  traMADol (ULTRAM) 50 MG tablet Take 1 tablet (50 mg total) by mouth every 6 (six) hours as needed for moderate pain. 07/20/19  Yes Gouru, Deanna Artis, MD    No Known Allergies  Family History  Problem Relation Age of Onset  . Epilepsy Mother   . Cancer Paternal Aunt        lung    Social History Social History   Tobacco Use  . Smoking status: Never Smoker  . Smokeless tobacco: Never Used  Substance Use Topics  . Alcohol use: Yes    Alcohol/week: 7.0 standard drinks    Types: 7 Cans of beer per week    Comment: ocass  . Drug use: No    Review of Systems Constitutional: Negative for fever Cardiovascular: Negative for chest pain. Respiratory: Negative for shortness of breath. Gastrointestinal: Moderate left lower quadrant abdominal pain. Genitourinary: Negative for urinary compaints Musculoskeletal: Negative for musculoskeletal complaints Neurological: Negative for headache All other  ROS negative  ____________________________________________   PHYSICAL EXAM:  VITAL SIGNS: ED Triage Vitals  Enc Vitals Group     BP 10/16/19 0558 136/86     Pulse Rate 10/16/19 0558 81     Resp 10/16/19 0558 16     Temp 10/16/19 0558 99.2 F (37.3 C)     Temp Source 10/16/19 0558 Oral     SpO2 10/16/19 0558 98 %     Weight 10/16/19 0559 165 lb (74.8 kg)     Height 10/16/19 0559 5\' 6"  (1.676 m)     Head Circumference --      Peak Flow --      Pain Score 10/16/19  0559 6     Pain Loc --      Pain Edu? --      Excl. in [place]? --    Constitutional: Alert and oriented. Well appearing and in no distress. Eyes: Normal exam ENT      Head: Normocephalic and atraumatic.      Mouth/Throat: Mucous membranes are moist. Cardiovascular: Normal rate, regular rhythm.  Respiratory: Normal respiratory effort without tachypnea nor retractions. Breath sounds are clear  Gastrointestinal: Soft, moderate left lower quadrant tenderness palpation with mild suprapubic tenderness.  No rebound guarding or distention. Musculoskeletal: Nontender with normal range of motion in all extremities Neurologic:  Normal speech and language. No gross focal neurologic deficits Skin:  Skin is warm, dry and intact.  Psychiatric: Mood and affect are normal.   ____________________________________________     RADIOLOGY  CT consistent with sigmoid diverticulitis  ____________________________________________   INITIAL IMPRESSION / ASSESSMENT AND PLAN / ED COURSE  Pertinent labs & imaging results that were available during my care of the patient were reviewed by me and considered in my medical decision making (see chart for details).   Patient presents to the emergency department for left lower quadrant abdominal pain, moderate tenderness on palpation.  Differential would include colitis, diverticulitis, urinary tract infection or pyelonephritis.  We will proceed with CT scan of the abdomen/pelvis to further evaluate.  Lab work is largely nonrevealing with a very slight leukocytosis, urinalysis pending.  CT consistent with sigmoid diverticulitis.  Extensive diverticulosis seen throughout the sigmoid colon.  We will treat with antibiotics discharge with a short course of pain medication.  We will refer to surgery for the patient to discuss possible elective sigmoidectomy once his symptoms resolved as this is the patient's fourth episode of diverticulitis in the same area.  Patient agreeable  to plan of care.  Discussed return precautions.  Edward Quinn was evaluated in Emergency Department on 10/16/2019 for the symptoms described in the history of present illness. He was evaluated in the context of the global COVID-19 pandemic, which necessitated consideration that the patient might be at risk for infection with the SARS-CoV-2 virus that causes COVID-19. Institutional protocols and algorithms that pertain to the evaluation of patients at risk for COVID-19 are in a state of rapid change based on information released by regulatory bodies including the CDC and federal and state organizations. These policies and algorithms were followed during the patient's care in the ED.  ____________________________________________   FINAL CLINICAL IMPRESSION(S) / ED DIAGNOSES  Left lower quadrant abdominal pain Sigmoid diverticulitis   Harvest Dark, MD 10/16/19 1008

## 2019-10-16 NOTE — ED Notes (Signed)
Pt verbalized understanding of discharge instructions. NAD at this time. 

## 2019-10-16 NOTE — ED Triage Notes (Signed)
Pt reports low abd pain since Sunday morning; history of diverticulitis and says this feels the same; denies N/V/D; pt in no acute distress;

## 2019-10-16 NOTE — ED Notes (Signed)
Patient transported to CT 

## 2019-11-07 ENCOUNTER — Ambulatory Visit: Payer: Self-pay | Admitting: Surgery

## 2019-11-08 ENCOUNTER — Other Ambulatory Visit: Payer: Self-pay

## 2019-11-08 ENCOUNTER — Encounter: Payer: Self-pay | Admitting: Surgery

## 2019-11-08 ENCOUNTER — Ambulatory Visit: Payer: Commercial Managed Care - PPO | Admitting: Surgery

## 2019-11-08 VITALS — BP 148/93 | HR 76 | Temp 97.7°F | Resp 14 | Ht 66.0 in | Wt 165.2 lb

## 2019-11-08 DIAGNOSIS — K5730 Diverticulosis of large intestine without perforation or abscess without bleeding: Secondary | ICD-10-CM | POA: Diagnosis not present

## 2019-11-08 NOTE — Patient Instructions (Addendum)
Please increase your water intake daily. You can purchase Fiber gummies over the counter.  High-Fiber Diet Fiber, also called dietary fiber, is a type of carbohydrate that is found in fruits, vegetables, whole grains, and beans. A high-fiber diet can have many health benefits. Your health care provider may recommend a high-fiber diet to help:  Prevent constipation. Fiber can make your bowel movements more regular.  Lower your cholesterol.  Relieve the following conditions: ? Swelling of veins in the anus (hemorrhoids). ? Swelling and irritation (inflammation) of specific areas of the digestive tract (uncomplicated diverticulosis). ? A problem of the large intestine (colon) that sometimes causes pain and diarrhea (irritable bowel syndrome, IBS).  Prevent overeating as part of a weight-loss plan.  Prevent heart disease, type 2 diabetes, and certain cancers. What is my plan? The recommended daily fiber intake in grams (g) includes:  38 g for men age 54 or younger.  30 g for men over age 54.  25 g for women age 54 or younger.  21 g for women over age 54. You can get the recommended daily intake of dietary fiber by:  Eating a variety of fruits, vegetables, grains, and beans.  Taking a fiber supplement, if it is not possible to get enough fiber through your diet. What do I need to know about a high-fiber diet?  It is better to get fiber through food sources rather than from fiber supplements. There is not a lot of research about how effective supplements are.  Always check the fiber content on the nutrition facts label of any prepackaged food. Look for foods that contain 5 g of fiber or more per serving.  Talk with a diet and nutrition specialist (dietitian) if you have questions about specific foods that are recommended or not recommended for your medical condition, especially if those foods are not listed below.  Gradually increase how much fiber you consume. If you increase your  intake of dietary fiber too quickly, you may have bloating, cramping, or gas.  Drink plenty of water. Water helps you to digest fiber. What are tips for following this plan?  Eat a wide variety of high-fiber foods.  Make sure that half of the grains that you eat each day are whole grains.  Eat breads and cereals that are made with whole-grain flour instead of refined flour or white flour.  Eat brown rice, bulgur wheat, or millet instead of white rice.  Start the day with a breakfast that is high in fiber, such as a cereal that contains 5 g of fiber or more per serving.  Use beans in place of meat in soups, salads, and pasta dishes.  Eat high-fiber snacks, such as berries, raw vegetables, nuts, and popcorn.  Choose whole fruits and vegetables instead of processed forms like juice or sauce. What foods can I eat?  Fruits Berries. Pears. Apples. Oranges. Avocado. Prunes and raisins. Dried figs. Vegetables Sweet potatoes. Spinach. Kale. Artichokes. Cabbage. Broccoli. Cauliflower. Green peas. Carrots. Squash. Grains Whole-grain breads. Multigrain cereal. Oats and oatmeal. Brown rice. Barley. Bulgur wheat. Millet. Quinoa. Bran muffins. Popcorn. Rye wafer crackers. Meats and other proteins Navy, kidney, and pinto beans. Soybeans. Split peas. Lentils. Nuts and seeds. Dairy Fiber-fortified yogurt. Beverages Fiber-fortified soy milk. Fiber-fortified orange juice. Other foods Fiber bars. The items listed above may not be a complete list of recommended foods and beverages. Contact a dietitian for more options. What foods are not recommended? Fruits Fruit juice. Cooked, strained fruit. Vegetables Fried potatoes. Canned vegetables.  Well-cooked vegetables. Grains White bread. Pasta made with refined flour. White rice. Meats and other proteins Fatty cuts of meat. Fried chicken or fried fish. Dairy Milk. Yogurt. Cream cheese. Sour cream. Fats and oils Butters. Beverages Soft drinks.  Other foods Cakes and pastries. The items listed above may not be a complete list of foods and beverages to avoid. Contact a dietitian for more information. Summary  Fiber is a type of carbohydrate. It is found in fruits, vegetables, whole grains, and beans.  There are many health benefits of eating a high-fiber diet, such as preventing constipation, lowering blood cholesterol, helping with weight loss, and reducing your risk of heart disease, diabetes, and certain cancers.  Gradually increase your intake of fiber. Increasing too fast can result in cramping, bloating, and gas. Drink plenty of water while you increase your fiber.  The best sources of fiber include whole fruits and vegetables, whole grains, nuts, seeds, and beans. This information is not intended to replace advice given to you by your health care provider. Make sure you discuss any questions you have with your health care provider. Document Released: 11/17/2005 Document Revised: 09/21/2017 Document Reviewed: 09/21/2017 Elsevier Patient Education  2020 [employer]  [employer] Content in Foods  See the following list for the dietary fiber content of some common foods. High-fiber foods High-fiber foods contain 4 grams or more (4g or more) of fiber per serving. They include:  Artichoke (fresh) - 1 medium has 10.3g of fiber.  Baked beans, plain or vegetarian (canned) -  cup has 5.2g of fiber.  Blackberries or raspberries (fresh) -  cup has 4g of fiber.  Bran cereal -  cup has 8.6g of fiber.  Bulgur (cooked) -  cup has 4g of fiber.  Kidney beans (canned) -  cup has 6.8g of fiber.  Lentils (cooked) -  cup has 7.8g of fiber.  Pear (fresh) - 1 medium has 5.1g of fiber.  Peas (frozen) -  cup has 4.4g of fiber.  Pinto beans (canned) -  cup has 5.5g of fiber.  Pinto beans (dried and cooked) -  cup has 7.7g of fiber.  Potato with skin (baked) - 1 medium has 4.4g of fiber.  Quinoa (cooked) -  cup has 5g of  fiber.  Soybeans (canned, frozen, or fresh) -  cup has 5.1g of fiber. Moderate-fiber foods Moderate-fiber foods contain 1-4 grams (1-4g) of fiber per serving. They include:  Almonds - 1 oz. has 3.5g of fiber.  Apple with skin - 1 medium has 3.3g of fiber.  Applesauce, sweetened -  cup has 1.5g of fiber.  Bagel, plain - one 4-inch (10-cm) bagel has 2g of fiber.  Banana - 1 medium has 3.1g of fiber.  Broccoli (cooked) -  cup has 2.5g of fiber.  Carrots (cooked) -  cup has 2.3g of fiber.  Corn (canned or frozen) -  cup has 2.1g of fiber.  Corn tortilla - one 6-inch (15-cm) tortilla has 1.5g of fiber.  Green beans (canned) -  cup has 2g of fiber.  Instant oatmeal -  cup has about 2g of fiber.  Long-grain brown rice (cooked) - 1 cup has 3.5g of fiber.  Macaroni, enriched (cooked) - 1 cup has 2.5g of fiber.  Melon - 1 cup has 1.4g of fiber.  Multigrain cereal -  cup has about 2-4g of fiber.  Orange - 1 small has 3.1g of fiber.  Potatoes, mashed -  cup has 1.6g of fiber.  Raisins - 1/4 cup has 1.6g  of fiber.  Squash -  cup has 2.9g of fiber.  Sunflower seeds -  cup has 1.1g of fiber.  Tomato - 1 medium has 1.5g of fiber.  Vegetable or soy patty - 1 has 3.4g of fiber.  Whole-wheat bread - 1 slice has 2g of fiber.  Whole-wheat spaghetti -  cup has 3.2g of fiber. Low-fiber foods Low-fiber foods contain less than 1 gram (less than 1g) of fiber per serving. They include:  Egg - 1 large.  Flour tortilla - one 6-inch (15-cm) tortilla.  Fruit juice -  cup.  Lettuce - 1 cup.  Meat, poultry, or fish - 1 oz.  Milk - 1 cup.  Spinach (raw) - 1 cup.  White bread - 1 slice.  White rice -  cup.  Yogurt -  cup. Actual amounts of fiber in foods may be different depending on processing. Talk with your dietitian about how much fiber you need in your diet. This information is not intended to replace advice given to you by your health care provider. Make  sure you discuss any questions you have with your health care provider. Document Released: 04/05/2007 Document Revised: 07/10/2016 Document Reviewed: 01/10/2016 Elsevier Patient Education  [place].     Diverticulitis  Diverticulitis is when small pockets in your large intestine (colon) get infected or swollen. This causes stomach pain and watery poop (diarrhea). These pouches are called diverticula. They form in people who have a condition called diverticulosis. Follow these instructions at home: Medicines  Take over-the-counter and prescription medicines only as told by your doctor. These include: ? Antibiotics. ? Pain medicines. ? Fiber pills. ? Probiotics. ? Stool softeners.  Do not drive or use heavy machinery while taking prescription pain medicine.  If you were prescribed an antibiotic, take it as told. Do not stop taking it even if you feel better. General instructions   Follow a diet as told by your doctor.  When you feel better, your doctor may tell you to change your diet. You may need to eat a lot of fiber. Fiber makes it easier to poop (have bowel movements). Healthy foods with fiber include: ? Berries. ? Beans. ? Lentils. ? Green vegetables.  Exercise 3 or more times a week. Aim for 30 minutes each time. Exercise enough to sweat and make your heart beat faster.  Keep all follow-up visits as told. This is important. You may need to have an exam of the large intestine. This is called a colonoscopy. Contact a doctor if:  Your pain does not get better.  You have a hard time eating or drinking.  You are not pooping like normal. Get help right away if:  Your pain gets worse.  Your problems do not get better.  Your problems get worse very fast.  You have a fever.  You throw up (vomit) more than one time.  You have poop that is: ? Bloody. ? Black. ? Tarry. Summary  Diverticulitis is when small pockets in your large intestine (colon) get  infected or swollen.  Take medicines only as told by your doctor.  Follow a diet as told by your doctor. This information is not intended to replace advice given to you by your health care provider. Make sure you discuss any questions you have with your health care provider. Document Released: 05/05/2008 Document Revised: 10/30/2017 Document Reviewed: 12/04/2016 Elsevier Patient Education  2020 [employer].

## 2019-11-08 NOTE — Progress Notes (Signed)
Patient ID: Edward Quinn, male   DOB: 11-14-1966, 53 y.o.   MRN: [id number]  Chief Complaint: History of diverticulitis  History of Present Illness Edward Quinn is a 53 y.o. male with a history of diverticulitis expanding over the last few years.  Multiple rounds of antibiotics, multiple CT scan exams.  Recently completed his antibiotics and currently feels essentially symptom-free.  He underwent a colonoscopy for screening in 2017. Though quite well versed in the myth of avoiding seeds, nuts and kernels; he has never been fully introduced to the importance of fiber supplementation.  Its been given to him as a handout but nothing he is taken seriously.  We discussed this option at length prior to proceeding with an elective sigmoid colectomy and he would like to trial it.  Past Medical History Past Medical History:  Diagnosis Date  . Arthritis    hands  . Diabetes mellitus without complication ([place])   . Diverticulitis   . GERD (gastroesophageal reflux disease)   . Hyperlipidemia   . Hypertension       Past Surgical History:  Procedure Laterality Date  . ACHILLES TENDON REPAIR    . APPENDECTOMY    . COLONOSCOPY WITH PROPOFOL N/A 09/12/2016   Procedure: COLONOSCOPY WITH PROPOFOL;  Surgeon: Lucilla Lame, MD;  Location: [facility];  Service: Endoscopy;  Laterality: N/A;  Diabetic    No Known Allergies  Current Outpatient Medications  Medication Sig Dispense Refill  . acetaminophen (TYLENOL) 325 MG tablet Take 1 tablet (325 mg total) by mouth every 6 (six) hours as needed for mild pain (or Fever >/= 101).    Marland Kitchen lisinopril (PRINIVIL,ZESTRIL) 10 MG tablet Take 10 mg by mouth daily.    Marland Kitchen lovastatin (MEVACOR) 20 MG tablet Take 20 mg by mouth daily.     . metFORMIN (GLUCOPHAGE) 500 MG tablet Take 500 mg by mouth 2 (two) times daily with a meal.     No current facility-administered medications for this visit.     Family History Family History  Problem Relation Age of Onset  .  Epilepsy Mother   . Cancer Paternal Aunt        lung      Social History Social History   Tobacco Use  . Smoking status: Never Smoker  . Smokeless tobacco: Never Used  Substance Use Topics  . Alcohol use: Yes    Alcohol/week: 7.0 standard drinks    Types: 7 Cans of beer per week    Comment: ocass  . Drug use: No        Review of Systems  Constitutional: Negative.   HENT: Negative.   Eyes: Negative.   Respiratory: Negative.   Cardiovascular: Negative.   Gastrointestinal: Positive for abdominal pain and constipation. Negative for blood in stool, heartburn and vomiting.  Genitourinary: Negative.   Musculoskeletal: Negative.   Skin: Negative.   Neurological: Negative.   Endo/Heme/Allergies: Negative.       Physical Exam Blood pressure (!) 148/93, pulse 76, temperature 97.7 F (36.5 C), temperature source Temporal, resp. rate 14, height 5\' 6"  (1.676 m), weight 165 lb 3.2 oz (74.9 kg), SpO2 97 %. Last Weight  Most recent update: 11/08/2019  2:43 PM   Weight  74.9 kg (165 lb 3.2 oz)            CONSTITUTIONAL: Well developed, and nourished, appropriately responsive and aware without distress.   EYES: Sclera non-icteric.   EARS, NOSE, MOUTH AND THROAT: Mask worn.   Hearing is intact to  voice.  NECK: Trachea is midline, and there is no jugular venous distension.  LYMPH NODES:  Lymph nodes in the neck are not enlarged. RESPIRATORY:  Lungs are clear, and breath sounds are equal bilaterally. Normal respiratory effort without pathologic use of accessory muscles. CARDIOVASCULAR: Heart is regular in rate and rhythm. GI: The abdomen is soft, nontender, and nondistended. There were no palpable masses. I did not appreciate hepatosplenomegaly. MUSCULOSKELETAL:  Symmetrical muscle tone appreciated in all four extremities.    SKIN: Skin turgor is normal. No pathologic skin lesions appreciated.  NEUROLOGIC:  Motor and sensation appear grossly normal.  Cranial nerves are grossly  without defect. PSYCH:  Alert and oriented to person, place and time. Affect is appropriate for situation.  Data Reviewed I have personally reviewed what is currently available of the patient's imaging, recent labs and medical records.   Radiology review:  Last CT scan imaging reviewed.    Assessment     Patient Active Problem List   Diagnosis Date Noted  . Diverticulitis 07/18/2019  . Diverticulitis of colon (without mention of hemorrhage)(562.11)   . Diverticulosis of large intestine without diverticulitis   . Diverticulitis of large intestine with perforation without bleeding   . Diverticulitis of colon with perforation 05/03/2016    Plan    We discussed the benefits of sigmoid colectomy with anticipated lifestyle improvement/quality of life improvements.  As he has not trialed a fiber conscious diet ever, he would like to attempt this prior to proceeding with surgery.  We will have him follow-up with 2 months or as needed.  Face-to-face time spent with the patient and accompanying care providers(if present) was 30 minutes, with more than 50% of the time spent counseling, educating, and coordinating care of the patient.      Campbell Lerner 11/08/2019, 3:13 PM

## 2019-11-11 ENCOUNTER — Other Ambulatory Visit: Payer: Self-pay

## 2019-11-11 DIAGNOSIS — Z20822 Contact with and (suspected) exposure to covid-19: Secondary | ICD-10-CM

## 2019-11-13 LAB — NOVEL CORONAVIRUS, NAA: SARS-CoV-2, NAA: DETECTED — AB

## 2019-11-16 ENCOUNTER — Other Ambulatory Visit: Payer: Self-pay

## 2019-11-16 ENCOUNTER — Emergency Department
Admission: EM | Admit: 2019-11-16 | Discharge: 2019-11-16 | Disposition: A | Discharge status: Home / Self Care | Payer: Commercial Managed Care - PPO | Attending: Emergency Medicine | Admitting: Emergency Medicine

## 2019-11-16 ENCOUNTER — Encounter: Payer: Self-pay | Admitting: Emergency Medicine

## 2019-11-16 ENCOUNTER — Emergency Department: Payer: Commercial Managed Care - PPO

## 2019-11-16 DIAGNOSIS — U071 COVID-19: Secondary | ICD-10-CM | POA: Diagnosis not present

## 2019-11-16 DIAGNOSIS — R0981 Nasal congestion: Secondary | ICD-10-CM | POA: Insufficient documentation

## 2019-11-16 DIAGNOSIS — F419 Anxiety disorder, unspecified: Secondary | ICD-10-CM | POA: Insufficient documentation

## 2019-11-16 DIAGNOSIS — R0602 Shortness of breath: Secondary | ICD-10-CM

## 2019-11-16 DIAGNOSIS — Z79899 Other long term (current) drug therapy: Secondary | ICD-10-CM | POA: Insufficient documentation

## 2019-11-16 DIAGNOSIS — E119 Type 2 diabetes mellitus without complications: Secondary | ICD-10-CM | POA: Diagnosis not present

## 2019-11-16 DIAGNOSIS — Z7984 Long term (current) use of oral hypoglycemic drugs: Secondary | ICD-10-CM | POA: Diagnosis not present

## 2019-11-16 DIAGNOSIS — I10 Essential (primary) hypertension: Secondary | ICD-10-CM | POA: Diagnosis not present

## 2019-11-16 MED ORDER — LORAZEPAM 0.5 MG PO TABS: 0.5000 mg | ORAL_TABLET | Freq: Two times a day (BID) | ORAL | 0 refills | Status: AC | PRN

## 2019-11-16 NOTE — ED Provider Notes (Signed)
[facility] Emergency Department Provider Note  Time seen: 1:26 PM  I have reviewed the triage vital signs and the nursing notes.   HISTORY  Chief Complaint Shortness of Breath   HPI Edward Quinn is a 53 y.o. male with a past medical history of diabetes, hypertension, hyperlipidemia presents to the emergency department for shortness of breath.  According to the patient he was diagnosed with Covid approximately 5 days ago.  States his symptoms have included mild runny nose with very minimal shortness of breath.  States his wife was diagnosed around the same time as she has had more fever body aches and shortness of breath.  States the nurse was calling them today to follow-up on their positive test result when the wife mentioned that they were feeling short of breath the nurse recommended to go to the emergency department for chest x-ray and evaluation.  Patient denies any chest pain states shortness of breath is minimal and only with exertion denies any pleuritic pain.  States he has never had a cough or fever.  States he would not have come on his own but he came with his wife as the nurse recommended it.   Past Medical History:  Diagnosis Date  . Arthritis    hands  . Diabetes mellitus without complication ([facility])   . Diverticulitis   . GERD (gastroesophageal reflux disease)   . Hyperlipidemia   . Hypertension     Patient Active Problem List   Diagnosis Date Noted  . Diverticulitis 07/18/2019  . Diverticulitis of colon (without mention of hemorrhage)(562.11)   . Diverticulosis of large intestine without diverticulitis   . Diverticulitis of large intestine with perforation without bleeding   . Diverticulitis of colon with perforation 05/03/2016    Past Surgical History:  Procedure Laterality Date  . ACHILLES TENDON REPAIR    . APPENDECTOMY    . COLONOSCOPY WITH PROPOFOL N/A 09/12/2016   Procedure: COLONOSCOPY WITH PROPOFOL;  Surgeon: Midge Minium, MD;   Location: [facility];  Service: Endoscopy;  Laterality: N/A;  Diabetic    Prior to Admission medications   Medication Sig Start Date End Date Taking? Authorizing Provider  acetaminophen (TYLENOL) 325 MG tablet Take 1 tablet (325 mg total) by mouth every 6 (six) hours as needed for mild pain (or Fever >/= 101). 07/20/19   Gouru, Deanna Artis, MD  lisinopril (PRINIVIL,ZESTRIL) 10 MG tablet Take 10 mg by mouth daily. 04/12/16   [provider]  lovastatin (MEVACOR) 20 MG tablet Take 20 mg by mouth daily.     [provider]  metFORMIN (GLUCOPHAGE) 500 MG tablet Take 500 mg by mouth 2 (two) times daily with a meal. 08/15/19   [provider]    No Known Allergies  Family History  Problem Relation Age of Onset  . Epilepsy Mother   . Cancer Paternal Aunt        lung    Social History Social History   Tobacco Use  . Smoking status: Never Smoker  . Smokeless tobacco: Never Used  Substance Use Topics  . Alcohol use: Yes    Alcohol/week: 7.0 standard drinks    Types: 7 Cans of beer per week    Comment: ocass  . Drug use: No    Review of Systems Constitutional: Negative for fever. ENT: Mild nasal congestion Cardiovascular: Negative for chest pain. Respiratory: Mild shortness of breath with exertion only.  Negative for cough Gastrointestinal: Negative for abdominal pain, vomiting and diarrhea. Musculoskeletal: Negative for  musculoskeletal complaints Neurological: Negative for headache All other ROS negative  ____________________________________________   PHYSICAL EXAM:  VITAL SIGNS: ED Triage Vitals  Enc Vitals Group     BP 11/16/19 1258 (!) 161/94     Pulse Rate 11/16/19 1258 84     Resp 11/16/19 1258 16     Temp 11/16/19 1258 98.2 F (36.8 C)     Temp Source 11/16/19 1258 Oral     SpO2 11/16/19 1258 99 %     Weight 11/16/19 1259 165 lb 2 oz (74.9 kg)     Height 11/16/19 1259 5\' 6"  (1.676 m)     Head Circumference --      Peak Flow --       Pain Score 11/16/19 1259 0     Pain Loc --      Pain Edu? --      Excl. in [place]? --    Constitutional: Alert and oriented. Well appearing and in no distress. Eyes: Normal exam ENT      Head: Normocephalic and atraumatic.      Mouth/Throat: Mucous membranes are moist. Cardiovascular: Normal rate, regular rhythm.  Respiratory: Normal respiratory effort without tachypnea nor retractions. Breath sounds are clear  Gastrointestinal: Soft and nontender. No distention.   Musculoskeletal: Nontender with normal range of motion in all extremities.  Neurologic:  Normal speech and language. No gross focal neurologic deficits  Skin:  Skin is warm, dry and intact.  Psychiatric: Mood and affect are normal.   ____________________________________________    RADIOLOGY  This x-ray is negative  ____________________________________________   INITIAL IMPRESSION / ASSESSMENT AND PLAN / ED COURSE  Pertinent labs & imaging results that were available during my care of the patient were reviewed by me and considered in my medical decision making (see chart for details).   Patient presents to the emergency department for mild shortness of breath with exertion recently diagnosed with Covid approximately 5 days ago, states his runny nose began approximately 6 or 7 days ago.  States most of symptoms is largely resolved denies any significant cough or any fever at any point.  Overall the patient appears very well reassuring vitals including afebrile normal pulse and respiratory rate with a 99% room air saturation.  We will obtain a chest x-ray as a precaution.  Patient agreeable to plan of care.  Chest x-ray is negative.  Overall patient appears well.  Patient states he is feeling very anxious regarding his diagnosis, states it has been keeping him up at night having difficulty sleeping and concentrating due to the anxiety.  No history of anxiety in the past.  Does not take any anxiety medications.  I do believe it  would be reasonable to prescribe the patient a very short course of a low milligram dosage of Ativan just for the next several days.  Patient agreeable plan of care discussed not drinking alcohol or driving while taking the medication.  Edward Quinn was evaluated in Emergency Department on 11/16/2019 for the symptoms described in the history of present illness. He was evaluated in the context of the global COVID-19 pandemic, which necessitated consideration that the patient might be at risk for infection with the SARS-CoV-2 virus that causes COVID-19. Institutional protocols and algorithms that pertain to the evaluation of patients at risk for COVID-19 are in a state of rapid change based on information released by regulatory bodies including the CDC and federal and state organizations. These policies and algorithms were followed during the patient's care in  the ED.  ____________________________________________   FINAL CLINICAL IMPRESSION(S) / ED DIAGNOSES  COVID-19 Dyspnea Anxiety   Minna AntisPaduchowski, Deuce Paternoster, MD 11/16/19 1408

## 2019-11-16 NOTE — ED Triage Notes (Signed)
Says he has covid.  The people who call them to see how told him to come in for chest xray for feeling short winded with exertion.  Spouse is here as well with same sx.  He initially was asymptomatic.

## 2019-11-27 ENCOUNTER — Ambulatory Visit
Admission: EM | Admit: 2019-11-27 | Discharge: 2019-11-27 | Disposition: A | Discharge status: Home / Self Care | Payer: Commercial Managed Care - PPO | Attending: Emergency Medicine | Admitting: Emergency Medicine

## 2019-11-27 ENCOUNTER — Encounter: Payer: Self-pay | Admitting: Emergency Medicine

## 2019-11-27 ENCOUNTER — Other Ambulatory Visit: Payer: Self-pay

## 2019-11-27 DIAGNOSIS — K5730 Diverticulosis of large intestine without perforation or abscess without bleeding: Secondary | ICD-10-CM

## 2019-11-27 DIAGNOSIS — K5732 Diverticulitis of large intestine without perforation or abscess without bleeding: Secondary | ICD-10-CM

## 2019-11-27 DIAGNOSIS — Z8619 Personal history of other infectious and parasitic diseases: Secondary | ICD-10-CM

## 2019-11-27 MED ORDER — LEVOFLOXACIN 750 MG PO TABS: 750.0000 mg | ORAL_TABLET | Freq: Every day | ORAL | 0 refills | Status: DC

## 2019-11-27 MED ORDER — LEVOFLOXACIN 750 MG PO TABS: 750.0000 mg | ORAL_TABLET | Freq: Every day | ORAL | 0 refills | Status: AC

## 2019-11-27 MED ORDER — METRONIDAZOLE 500 MG PO TABS: 500.0000 mg | ORAL_TABLET | Freq: Three times a day (TID) | ORAL | 0 refills | Status: AC

## 2019-11-27 MED ORDER — METRONIDAZOLE 500 MG PO TABS: 500.0000 mg | ORAL_TABLET | Freq: Three times a day (TID) | ORAL | 0 refills | Status: DC

## 2019-11-27 NOTE — Discharge Instructions (Addendum)
You are being treated for a diverticulitis flare.  Take the antibiotics as prescribed until they're finished. If you think you're having a reaction, stop the medication, take benadryl and go to the nearest urgent care/emergency room. Take a probiotic while taking the antibiotic to decrease the chances of stomach upset.   Follow-up with your gastroenterologist as soon as possible.  If your symptoms get worse, had to emergency department for further imaging.

## 2019-11-27 NOTE — ED Provider Notes (Signed)
[facility] - [place], [place]   Name: Edward Quinn DOB: Feb 20, 1966 MRN: [id number] CSN: [id number] PCP: System, Pcp Not In  Arrival date and time:  11/27/19 1042  Chief Complaint:  Abdominal Pain (lower)   NOTE: Prior to seeing the patient today, I have reviewed the triage nursing documentation and vital signs. Clinical staff has updated patient's PMH/PSHx, current medication list, and drug allergies/intolerances to ensure comprehensive history available to assist in medical decision making.   History:   HPI: Edward Quinn is a 53 y.o. male who presents today with complaints of left lower abdominal pain.  He states the pain is very similar to his previous bouts of diverticulosis, with his most recent one occurring approximately 1 month ago.  He was treated with Augmentin received positive results with that treatment.  He has seen a gastroenterologist since his last diverticulosis flare.  He decided to manage his symptoms with dietary changes and opted out of surgery at this time. Symptoms with this occurrence are lower abdominal pain, diarrhea, and urinary frequency due to bloating.  Denies fever, illness, and/or body aches.  He has recently recovered from COVID-19.  Past Medical History:  Diagnosis Date  . Arthritis    hands  . Diabetes mellitus without complication ([place])   . Diverticulitis   . GERD (gastroesophageal reflux disease)   . Hyperlipidemia   . Hypertension     Past Surgical History:  Procedure Laterality Date  . ACHILLES TENDON REPAIR    . APPENDECTOMY    . COLONOSCOPY WITH PROPOFOL N/A 09/12/2016   Procedure: COLONOSCOPY WITH PROPOFOL;  Surgeon: Lucilla Lame, MD;  Location: [facility];  Service: Endoscopy;  Laterality: N/A;  Diabetic    Family History  Problem Relation Age of Onset  . Epilepsy Mother   . Cancer Paternal Aunt        lung    Social History   Tobacco Use  . Smoking status: Never Smoker  . Smokeless tobacco: Never Used    Substance Use Topics  . Alcohol use: Yes    Alcohol/week: 7.0 standard drinks    Types: 7 Cans of beer per week    Comment: ocass  . Drug use: No    Patient Active Problem List   Diagnosis Date Noted  . Diverticulitis 07/18/2019  . Diverticulitis of colon (without mention of hemorrhage)(562.11)   . Diverticulosis of large intestine without diverticulitis   . Diverticulitis of large intestine with perforation without bleeding   . Diverticulitis of colon with perforation 05/03/2016    Home Medications:    Current Meds  Medication Sig  . acetaminophen (TYLENOL) 325 MG tablet Take 1 tablet (325 mg total) by mouth every 6 (six) hours as needed for mild pain (or Fever >/= 101).  Marland Kitchen lisinopril (PRINIVIL,ZESTRIL) 10 MG tablet Take 10 mg by mouth daily.  Marland Kitchen LORazepam (ATIVAN) 0.5 MG tablet Take 1 tablet (0.5 mg total) by mouth 2 (two) times daily as needed for anxiety.  . lovastatin (MEVACOR) 20 MG tablet Take 20 mg by mouth daily.   . metFORMIN (GLUCOPHAGE) 500 MG tablet Take 500 mg by mouth 2 (two) times daily with a meal.    Allergies:   Patient has no known allergies.  Review of Systems (ROS): Review of Systems  Constitutional: Negative for appetite change, chills and fever.  Gastrointestinal: Positive for abdominal distention, abdominal pain and diarrhea. Negative for nausea, rectal pain and vomiting.  Genitourinary: Positive for dysuria. Negative for flank pain.  Musculoskeletal:  Negative for myalgias.     Vital Signs: Today's Vitals   11/27/19 1108 11/27/19 1112  BP:  (!) 135/96  Pulse:  84  Resp:  18  Temp:  98.4 F (36.9 C)  TempSrc:  Oral  SpO2:  100%  Weight: 165 lb (74.8 kg)   Height: 5\' 6"  (1.676 m)   PainSc: 4      Physical Exam: Physical Exam Vitals and nursing note reviewed.  Eyes:     Extraocular Movements: Extraocular movements intact.     Pupils: Pupils are equal, round, and reactive to light.  Cardiovascular:     Rate and Rhythm: Normal rate and  regular rhythm.     Heart sounds: Normal heart sounds.  Pulmonary:     Effort: Pulmonary effort is normal.     Breath sounds: Normal breath sounds.  Abdominal:     General: Bowel sounds are normal. There is distension.     Palpations: Abdomen is soft.     Tenderness: There is abdominal tenderness in the left lower quadrant. There is guarding.  Skin:    Capillary Refill: Capillary refill takes less than 2 seconds.  Neurological:     Mental Status: He is alert.      Urgent Care Treatments / Results:   LABS: PLEASE NOTE: all labs that were ordered this encounter are listed, however only abnormal results are displayed. Labs Reviewed - No data to display  EKG: -None  RADIOLOGY: No results found.  PROCEDURES: Procedures  MEDICATIONS RECEIVED THIS VISIT: Medications - No data to display  PERTINENT CLINICAL COURSE NOTES/UPDATES:   Initial Impression / Assessment and Plan / Urgent Care Course:  Pertinent labs & imaging results that were available during my care of the patient were personally reviewed by me and considered in my medical decision making (see lab/imaging section of note for values and interpretations).  Edward Quinn is a 53 y.o. male who presents to [facility] today with complaints of abdominal pain, diagnosed with diverticulosis, and treated as such with the medications below. NP and patient reviewed discharge instructions below during visit.   Patient is well appearing overall in clinic today. He does not appear to be in any acute distress. Presenting symptoms (see HPI) and exam as documented above.   I have reviewed the follow up and strict return precautions for any new or worsening symptoms. Patient is aware of symptoms that would be deemed urgent/emergent, and would thus require further evaluation either here or in the emergency department. At the time of discharge, he verbalized understanding and consent with the discharge plan as it was reviewed with  him. All questions were fielded by provider and/or clinic staff prior to patient discharge.    Final Clinical Impressions / Urgent Care Diagnoses:   Final diagnoses:  Diverticulitis of colon    New Prescriptions:  [place] Controlled Substance Registry consulted? Not Applicable  Meds ordered this encounter  Medications  . DISCONTD: levofloxacin (LEVAQUIN) 750 MG tablet    Sig: Take 1 tablet (750 mg total) by mouth daily for 7 days.    Dispense:  7 tablet    Refill:  0  . DISCONTD: metroNIDAZOLE (FLAGYL) 500 MG tablet    Sig: Take 1 tablet (500 mg total) by mouth 3 (three) times daily for 7 days.    Dispense:  21 tablet    Refill:  0  . levofloxacin (LEVAQUIN) 750 MG tablet    Sig: Take 1 tablet (750 mg total) by mouth daily for  7 days.    Dispense:  7 tablet    Refill:  0  . metroNIDAZOLE (FLAGYL) 500 MG tablet    Sig: Take 1 tablet (500 mg total) by mouth 3 (three) times daily for 7 days.    Dispense:  21 tablet    Refill:  0      Discharge Instructions     You are being treated for a diverticulitis flare.  Take the antibiotics as prescribed until they're finished. If you think you're having a reaction, stop the medication, take benadryl and go to the nearest urgent care/emergency room. Take a probiotic while taking the antibiotic to decrease the chances of stomach upset.   Follow-up with your gastroenterologist as soon as possible.  If your symptoms get worse, had to emergency department for further imaging.    Recommended Follow up Care:  Patient encouraged to follow up with the following provider within the specified time frame, or sooner as dictated by the severity of his symptoms. As always, he was instructed that for any urgent/emergent care needs, he should seek care either here or in the emergency department for more immediate evaluation.   Edward MechLunise Parish Dubose, DNP, NP-c    Edward MechBenjamin, Treavon Castilleja, NP 11/27/19 1256

## 2019-11-27 NOTE — ED Triage Notes (Signed)
Pt c/o lower abdominal pain, diarrhea, urinary frequency. Started yesterday. He states that he has had diverticulosis in the past and presented the exact same way (even with the urinary frequency).    Pt was covid positive. 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

## 2019-11-30 ENCOUNTER — Encounter: Payer: Self-pay | Admitting: Emergency Medicine

## 2019-11-30 ENCOUNTER — Other Ambulatory Visit: Payer: Self-pay

## 2019-11-30 ENCOUNTER — Emergency Department
Admission: EM | Admit: 2019-11-30 | Discharge: 2019-11-30 | Disposition: A | Discharge status: Home / Self Care | Payer: Commercial Managed Care - PPO | Attending: Emergency Medicine | Admitting: Emergency Medicine

## 2019-11-30 ENCOUNTER — Emergency Department: Payer: Commercial Managed Care - PPO

## 2019-11-30 DIAGNOSIS — I10 Essential (primary) hypertension: Secondary | ICD-10-CM | POA: Insufficient documentation

## 2019-11-30 DIAGNOSIS — Z79899 Other long term (current) drug therapy: Secondary | ICD-10-CM | POA: Diagnosis not present

## 2019-11-30 DIAGNOSIS — E119 Type 2 diabetes mellitus without complications: Secondary | ICD-10-CM | POA: Diagnosis not present

## 2019-11-30 DIAGNOSIS — K5792 Diverticulitis of intestine, part unspecified, without perforation or abscess without bleeding: Secondary | ICD-10-CM | POA: Diagnosis not present

## 2019-11-30 DIAGNOSIS — R1030 Lower abdominal pain, unspecified: Secondary | ICD-10-CM | POA: Diagnosis present

## 2019-11-30 DIAGNOSIS — Z7984 Long term (current) use of oral hypoglycemic drugs: Secondary | ICD-10-CM | POA: Diagnosis not present

## 2019-11-30 LAB — COMPREHENSIVE METABOLIC PANEL
ALT: 24 U/L (ref 0–44)
AST: 25 U/L (ref 15–41)
Albumin: 4.3 g/dL (ref 3.5–5.0)
Alkaline Phosphatase: 64 U/L (ref 38–126)
Anion gap: 13 (ref 5–15)
BUN: 12 mg/dL (ref 6–20)
CO2: 27 mmol/L (ref 22–32)
Calcium: 9.4 mg/dL (ref 8.9–10.3)
Chloride: 101 mmol/L (ref 98–111)
Creatinine, Ser: 1.09 mg/dL (ref 0.61–1.24)
GFR calc Af Amer: >60 mL/min (ref >60)
GFR calc non Af Amer: >60 mL/min (ref >60)
Glucose, Bld: 139 mg/dL — ABNORMAL HIGH (ref 70–99)
Potassium: 4 mmol/L (ref 3.5–5.1)
Sodium: 141 mmol/L (ref 135–145)
Total Bilirubin: 1.3 mg/dL — ABNORMAL HIGH (ref 0.3–1.2)
Total Protein: 8 g/dL (ref 6.5–8.1)

## 2019-11-30 LAB — CBC
HCT: 45.2 % (ref 39.0–52.0)
Hemoglobin: 15.3 g/dL (ref 13.0–17.0)
MCH: 29.5 pg (ref 26.0–34.0)
MCHC: 33.8 g/dL (ref 30.0–36.0)
MCV: 87.3 fL (ref 80.0–100.0)
Platelets: 241 10*3/uL (ref 150–400)
RBC: 5.18 MIL/uL (ref 4.22–5.81)
RDW: 12.7 % (ref 11.5–15.5)
WBC: 12 10*3/uL — ABNORMAL HIGH (ref 4.0–10.5)
nRBC: 0 % (ref 0.0–0.2)

## 2019-11-30 LAB — URINALYSIS, COMPLETE (UACMP) WITH MICROSCOPIC
Bacteria, UA: NONE SEEN
Bilirubin Urine: NEGATIVE
Glucose, UA: NEGATIVE mg/dL
Hgb urine dipstick: NEGATIVE
Ketones, ur: NEGATIVE mg/dL
Nitrite: NEGATIVE
Protein, ur: NEGATIVE mg/dL
Specific Gravity, Urine: 1.019 (ref 1.005–1.030)
Squamous Epithelial / HPF: NONE SEEN (ref 0–5)
pH: 5 (ref 5.0–8.0)

## 2019-11-30 LAB — LIPASE, BLOOD: Lipase: 31 U/L (ref 11–51)

## 2019-11-30 MED ORDER — AMOXICILLIN-POT CLAVULANATE 875-125 MG PO TABS: 1.0000 | ORAL_TABLET | Freq: Two times a day (BID) | ORAL | 0 refills | Status: AC

## 2019-11-30 MED ORDER — HYDROCODONE-ACETAMINOPHEN 5-325 MG PO TABS: 1.0000 | ORAL_TABLET | Freq: Four times a day (QID) | ORAL | 0 refills | Status: DC | PRN

## 2019-11-30 MED ORDER — SODIUM CHLORIDE 0.9% FLUSH: 3.0000 mL | Freq: Once | INTRAVENOUS | Status: DC

## 2019-11-30 NOTE — ED Triage Notes (Signed)
Patient says he went to u rgent care the other day and put on antibiotics for diverticulitis. He has had this before and says it feels like thatexcept it is on right side and not left.  Says pain subsides when he is still, but increases with movement including walking.  Not getting better.

## 2019-11-30 NOTE — ED Provider Notes (Signed)
[facility] Emergency Department Provider Note  Time seen: 1:54 PM  I have reviewed the triage vital signs and the nursing notes.   HISTORY  Chief Complaint Abdominal Pain   HPI Edward Quinn is a 53 y.o. male with a past medical history of arthritis, diabetes, gastric reflux, hypertension, hyperlipidemia presents to the emergency department for lower abdominal tenderness.  According to the patient for the past 5 days or so he has been experiencing lower abdominal pain, went to urgent care was diagnosed with possible diverticulitis clinically placed on ciprofloxacin and Flagyl several days ago.  Patient states no improvement since starting antibiotics so he came to the emergency department for evaluation.  Denies any dysuria denies any fever.  Patient did test positive for Covid virus approximately 20 days ago.   Past Medical History:  Diagnosis Date  . Arthritis    hands  . Diabetes mellitus without complication (HCC)   . Diverticulitis   . GERD (gastroesophageal reflux disease)   . Hyperlipidemia   . Hypertension     Patient Active Problem List   Diagnosis Date Noted  . Diverticulitis 07/18/2019  . Diverticulitis of colon (without mention of hemorrhage)(562.11)   . Diverticulosis of large intestine without diverticulitis   . Diverticulitis of large intestine with perforation without bleeding   . Diverticulitis of colon with perforation 05/03/2016    Past Surgical History:  Procedure Laterality Date  . ACHILLES TENDON REPAIR    . APPENDECTOMY    . COLONOSCOPY WITH PROPOFOL N/A 09/12/2016   Procedure: COLONOSCOPY WITH PROPOFOL;  Surgeon: Midge Minium, MD;  Location: [facility];  Service: Endoscopy;  Laterality: N/A;  Diabetic    Prior to Admission medications   Medication Sig Start Date End Date Taking? Authorizing Provider  acetaminophen (TYLENOL) 325 MG tablet Take 1 tablet (325 mg total) by mouth every 6 (six) hours as needed for mild pain  (or Fever >/= 101). 07/20/19   Ramonita Lab, MD  levofloxacin (LEVAQUIN) 750 MG tablet Take 1 tablet (750 mg total) by mouth daily for 7 days. 11/27/19 12/04/19  Bailey Mech, NP  lisinopril (PRINIVIL,ZESTRIL) 10 MG tablet Take 10 mg by mouth daily. 04/12/16   [provider]  LORazepam (ATIVAN) 0.5 MG tablet Take 1 tablet (0.5 mg total) by mouth 2 (two) times daily as needed for anxiety. 11/16/19 11/15/20  Minna Antis, MD  lovastatin (MEVACOR) 20 MG tablet Take 20 mg by mouth daily.     [provider]  metFORMIN (GLUCOPHAGE) 500 MG tablet Take 500 mg by mouth 2 (two) times daily with a meal. 08/15/19   [provider]  metroNIDAZOLE (FLAGYL) 500 MG tablet Take 1 tablet (500 mg total) by mouth 3 (three) times daily for 7 days. 11/27/19 12/04/19  Bailey Mech, NP    No Known Allergies  Family History  Problem Relation Age of Onset  . Epilepsy Mother   . Cancer Paternal Aunt        lung    Social History Social History   Tobacco Use  . Smoking status: Never Smoker  . Smokeless tobacco: Never Used  Substance Use Topics  . Alcohol use: Yes    Alcohol/week: 7.0 standard drinks    Types: 7 Cans of beer per week    Comment: ocass  . Drug use: No    Review of Systems Constitutional: Negative for fever. Cardiovascular: Negative for chest pain. Respiratory: Negative for shortness of breath. Gastrointestinal: Lower abdominal pain.  Negative for nausea vomiting.  Genitourinary: Negative for urinary compaints Musculoskeletal: Negative for musculoskeletal complaints Neurological: Negative for headache All other ROS negative  ____________________________________________   PHYSICAL EXAM:  VITAL SIGNS: ED Triage Vitals  Enc Vitals Group     BP 11/30/19 1155 (!) 132/93     Pulse Rate 11/30/19 1155 83     Resp 11/30/19 1155 14     Temp 11/30/19 1155 98 F (36.7 C)     Temp Source 11/30/19 1155 Oral     SpO2 11/30/19 1155 100 %     Weight  11/30/19 1155 165 lb (74.8 kg)     Height 11/30/19 1155 5\' 6"  (1.676 m)     Head Circumference --      Peak Flow --      Pain Score 11/30/19 1154 4     Pain Loc --      Pain Edu? --      Excl. in GC? --     Constitutional: Alert and oriented. Well appearing and in no distress. Eyes: Normal exam ENT      Head: Normocephalic and atraumatic.      Mouth/Throat: Mucous membranes are moist. Cardiovascular: Normal rate, regular rhythm.  Respiratory: Normal respiratory effort without tachypnea nor retractions. Breath sounds are clear  Gastrointestinal: Soft, moderate left lower quadrant tenderness palpation with mild right lower quadrant tenderness.  No rebound guarding or distention. Musculoskeletal: Nontender with normal range of motion in all extremities.  Neurologic:  Normal speech and language. No gross focal neurologic deficits Skin:  Skin is warm, dry and intact.  Psychiatric: Mood and affect are normal.  ____________________________________________   RADIOLOGY  IMPRESSION:  1. There is currently mid sigmoid colonic diverticulitis without  perforation or abscess. The more proximal sigmoid colon  diverticulitis seen on study from approximately 6 weeks prior has  resolved. There are left-sided colonic diverticula elsewhere without  inflammation.   2. No bowel obstruction. No abscess in the abdomen or pelvis.  Appendix absent. No periappendiceal region inflammation.   3. No renal or ureteral calculi. No hydronephrosis. Urinary bladder  wall thickness normal.   4. Small hiatal hernia. Fat in each inguinal ring.   5. Hepatic steatosis.   6. Aortic Atherosclerosis (ICD10-I70.0).   ____________________________________________   INITIAL IMPRESSION / ASSESSMENT AND PLAN / ED COURSE  Pertinent labs & imaging results that were available during my care of the patient were reviewed by me and considered in my medical decision making (see chart for details).   Patient  presents emergency department for lower abdominal pain.  Patient has a significant history of diverticulitis, start antibiotics several days ago but continued to have discomfort so he came to the emergency department for evaluation.  Patient's lab work shows a mild leukocytosis otherwise large within normal limits.  We will obtain a CT renal scan to further evaluate.  Patient agreeable to plan of care.  Overall the patient appears quite well with a reassuring physical exam sides mild to moderate lower abdominal tenderness.  Patient CT scan is consistent with sigmoid diverticulitis.  Patient is currently on antibiotics.  We will discharge with pain medication.  Patient states he recently followed up with general surgery and will continue to do so.  Britt BottomRobert Grime was evaluated in Emergency Department on 11/30/2019 for the symptoms described in the history of present illness. He was evaluated in the context of the global COVID-19 pandemic, which necessitated consideration that the patient might be at risk for infection with the SARS-CoV-2 virus that causes COVID-19.  Institutional protocols and algorithms that pertain to the evaluation of patients at risk for COVID-19 are in a state of rapid change based on information released by regulatory bodies including the CDC and federal and state organizations. These policies and algorithms were followed during the patient's care in the ED.  ____________________________________________   FINAL CLINICAL IMPRESSION(S) / ED DIAGNOSES  Abdominal pain Uncomplicated diverticulitis   Harvest Dark, MD 11/30/19 1427

## 2019-11-30 NOTE — ED Notes (Signed)

## 2020-01-19 ENCOUNTER — Ambulatory Visit: Payer: Self-pay | Admitting: Surgery

## 2020-01-24 ENCOUNTER — Ambulatory Visit: Payer: Self-pay | Admitting: Surgery

## 2020-01-31 ENCOUNTER — Ambulatory Visit (INDEPENDENT_AMBULATORY_CARE_PROVIDER_SITE_OTHER): Payer: Commercial Managed Care - PPO | Admitting: Surgery

## 2020-01-31 ENCOUNTER — Encounter: Payer: Self-pay | Admitting: Surgery

## 2020-01-31 ENCOUNTER — Other Ambulatory Visit: Payer: Self-pay

## 2020-01-31 VITALS — BP 160/96 | HR 71 | Temp 98.1°F | Resp 14 | Ht 66.0 in | Wt 164.0 lb

## 2020-01-31 DIAGNOSIS — K5730 Diverticulosis of large intestine without perforation or abscess without bleeding: Secondary | ICD-10-CM | POA: Diagnosis not present

## 2020-01-31 NOTE — Patient Instructions (Addendum)
Continue your fiber supplements. Increase you fluid intake.   We will set you up for a phone visit with Dr Claudine Mouton to discuss follow up.

## 2020-01-31 NOTE — Progress Notes (Signed)
Patient ID: Edward Quinn, male   DOB: [birth year], 54 y.o.   MRN: [id number]  Chief Complaint: Follow-up for diverticulitis.  History of Present Illness Edward Quinn is a 54 y.o. male with a follow-up visit as planned.  I last saw this gentleman in December following episodes of diverticulitis which were treated as an outpatient with oral antibiotics.  He was not ready to proceed with surgery and wanted to trial a fiber supplementation and a bowel regimen to help.  However within the month he had another attack which apparently was localized to another site of his sigmoid/descending colon.  Apparently once again treated as an outpatient with oral antibiotics he did well.  He started on fiber supplementation and has currently used Benefiber at about one third of the recommended dosing but reports he is doing very well with no new issues since January denies nausea and vomiting, reports his appetite is good.  Has regular bowel movements.  He denies any abdominal pain or tenderness.  He reports he has been walking quite regularly and this is helped with his bowel regimen as well.  He feels well and is currently still not interested in surgery. He will be seeing his primary care in about a month, and will discuss his options then.  Past Medical History Past Medical History:  Diagnosis Date  . Arthritis    hands  . Diabetes mellitus without complication (HCC)   . Diverticulitis   . GERD (gastroesophageal reflux disease)   . Hyperlipidemia   . Hypertension       Past Surgical History:  Procedure Laterality Date  . ACHILLES TENDON REPAIR    . APPENDECTOMY    . COLONOSCOPY WITH PROPOFOL N/A 09/12/2016   Procedure: COLONOSCOPY WITH PROPOFOL;  Surgeon: Midge Minium, MD;  Location: [facility];  Service: Endoscopy;  Laterality: N/A;  Diabetic    No Known Allergies  Current Outpatient Medications  Medication Sig Dispense Refill  . acetaminophen (TYLENOL) 325 MG tablet Take 1 tablet (325 mg  total) by mouth every 6 (six) hours as needed for mild pain (or Fever >/= 101).    Marland Kitchen lisinopril (PRINIVIL,ZESTRIL) 10 MG tablet Take 10 mg by mouth daily.    Marland Kitchen LORazepam (ATIVAN) 0.5 MG tablet Take 1 tablet (0.5 mg total) by mouth 2 (two) times daily as needed for anxiety. 20 tablet 0  . lovastatin (MEVACOR) 20 MG tablet Take 20 mg by mouth daily.     . metFORMIN (GLUCOPHAGE) 500 MG tablet Take 500 mg by mouth 2 (two) times daily with a meal.    . tamsulosin (FLOMAX) 0.4 MG CAPS capsule Take 0.4 mg by mouth daily.     No current facility-administered medications for this visit.    Family History Family History  Problem Relation Age of Onset  . Epilepsy Mother   . Cancer Paternal Aunt        lung      Social History Social History   Tobacco Use  . Smoking status: Never Smoker  . Smokeless tobacco: Never Used  Substance Use Topics  . Alcohol use: Yes    Alcohol/week: 7.0 standard drinks    Types: 7 Cans of beer per week    Comment: ocass  . Drug use: No        Review of Systems  Constitutional: Negative.   HENT: Negative.   Eyes: Negative.   Respiratory: Negative.   Cardiovascular: Negative.   Gastrointestinal: Negative.   Genitourinary: Negative.   Musculoskeletal: Negative.  Skin: Negative.   Neurological: Negative.   Endo/Heme/Allergies: Negative.       Physical Exam Blood pressure (!) 160/96, pulse 71, temperature 98.1 F (36.7 C), resp. rate 14, height 5\' 6"  (1.676 m), weight 164 lb (74.4 kg), SpO2 98 %. Last Weight  Most recent update: 01/31/2020  1:23 PM   Weight  74.4 kg (164 lb)            CONSTITUTIONAL: Well developed, and nourished, appropriately responsive and aware without distress.   EYES: Sclera non-icteric.   EARS, NOSE, MOUTH AND THROAT: Mask worn.   Hearing is intact to voice.  NECK: Trachea is midline, and there is no jugular venous distension.  LYMPH NODES:  Lymph nodes in the neck are not enlarged. RESPIRATORY:  Lungs are clear, and  breath sounds are equal bilaterally. Normal respiratory effort without pathologic use of accessory muscles. CARDIOVASCULAR: Heart is regular in rate and rhythm. GI: The abdomen is soft, nontender, and nondistended.  MUSCULOSKELETAL:  Symmetrical muscle tone appreciated in all four extremities.    SKIN: Skin turgor is normal. No pathologic skin lesions appreciated.  NEUROLOGIC:  Motor and sensation appear grossly normal.  Cranial nerves are grossly without defect. PSYCH:  Alert and oriented to person, place and time. Affect is appropriate for situation.  Data Reviewed I have personally reviewed what is currently available of the patient's imaging, recent labs and medical records.   Labs:  CBC Latest Ref Rng & Units 11/30/2019 10/16/2019 07/19/2019  WBC 4.0 - 10.5 K/uL 12.0(H) 11.6(H) 9.2  Hemoglobin 13.0 - 17.0 g/dL 15.3 15.4 14.0  Hematocrit 39.0 - 52.0 % 45.2 45.0 41.1  Platelets 150 - 400 K/uL 241 182 180   CMP Latest Ref Rng & Units 11/30/2019 10/16/2019 07/19/2019  Glucose 70 - 99 mg/dL 139(H) 165(H) 116(H)  BUN 6 - 20 mg/dL 12 13 11   Creatinine 0.61 - 1.24 mg/dL 1.09 0.96 0.91  Sodium 135 - 145 mmol/L 141 136 137  Potassium 3.5 - 5.1 mmol/L 4.0 4.0 3.8  Chloride 98 - 111 mmol/L 101 101 104  CO2 22 - 32 mmol/L 27 25 27   Calcium 8.9 - 10.3 mg/dL 9.4 9.1 8.2(L)  Total Protein 6.5 - 8.1 g/dL 8.0 8.0 -  Total Bilirubin 0.3 - 1.2 mg/dL 1.3(H) 1.8(H) -  Alkaline Phos 38 - 126 U/L 64 68 -  AST 15 - 41 U/L 25 22 -  ALT 0 - 44 U/L 24 22 -      Imaging: Radiology review: Last CT scan imaging from December reviewed. Within last 24 hrs: No results found.  Assessment    As noted below.  Patient Active Problem List   Diagnosis Date Noted  . Diverticulitis 07/18/2019  . Diverticulitis of colon (without mention of hemorrhage)(562.11)   . Diverticulosis of large intestine without diverticulitis   . Diverticulitis of large intestine with perforation without bleeding   . Diverticulitis  of colon with perforation 05/03/2016    Plan    At present time he would like to continue medical management.  We discussed the pros and cons of surgery and the inability to provide any guarantees that this will resolve the issue once and for all.  He is currently resistant to undergo surgery considering the downtime and recovery.  It appears that he has done well with outpatient management and as long as he does not require emergency room visits or inpatient management he will defer definitive surgical treatment for now.  We encouraged him that we  will always be readily available to assist him should he have a change of his mind or have too frequent of recurrences.  He feels the fiber regimen and exercise has been helpful.  Anticipating a follow-up phone conversation after he sees his primary care in April.  Face-to-face time spent with the patient and accompanying care providers(if present) was 25 minutes, with more than 50% of the time spent counseling, educating, and coordinating care of the patient.      Campbell Lerner M.D., FACS 01/31/2020, 1:46 PM

## 2020-03-15 ENCOUNTER — Telehealth: Payer: Commercial Managed Care - PPO | Admitting: Surgery

## 2020-03-20 ENCOUNTER — Telehealth (INDEPENDENT_AMBULATORY_CARE_PROVIDER_SITE_OTHER): Payer: Commercial Managed Care - PPO | Admitting: Surgery

## 2020-03-20 ENCOUNTER — Other Ambulatory Visit: Payer: Self-pay

## 2020-03-20 DIAGNOSIS — K5730 Diverticulosis of large intestine without perforation or abscess without bleeding: Secondary | ICD-10-CM

## 2020-03-20 NOTE — Progress Notes (Signed)
Left msg on VM, w/o clear identification, so kept very brief, offered to plan another opportunity for a virtual visit.

## 2020-03-22 ENCOUNTER — Telehealth (INDEPENDENT_AMBULATORY_CARE_PROVIDER_SITE_OTHER): Payer: Commercial Managed Care - PPO | Admitting: Surgery

## 2020-03-22 ENCOUNTER — Other Ambulatory Visit: Payer: Self-pay

## 2020-03-22 DIAGNOSIS — K5730 Diverticulosis of large intestine without perforation or abscess without bleeding: Secondary | ICD-10-CM

## 2020-03-23 NOTE — Progress Notes (Signed)
Simply reviewed he's doing so very well, that he has no interest in pursuing an operation at present, will f/u with [place] as needed.

## 2021-04-07 ENCOUNTER — Emergency Department: Payer: Commercial Managed Care - PPO

## 2021-04-07 ENCOUNTER — Encounter: Payer: Self-pay | Admitting: Emergency Medicine

## 2021-04-07 ENCOUNTER — Emergency Department
Admission: EM | Admit: 2021-04-07 | Discharge: 2021-04-07 | Disposition: A | Discharge status: Home / Self Care | Payer: Commercial Managed Care - PPO | Attending: Emergency Medicine | Admitting: Emergency Medicine

## 2021-04-07 ENCOUNTER — Other Ambulatory Visit: Payer: Self-pay

## 2021-04-07 DIAGNOSIS — I10 Essential (primary) hypertension: Secondary | ICD-10-CM | POA: Diagnosis not present

## 2021-04-07 DIAGNOSIS — Y990 Civilian activity done for income or pay: Secondary | ICD-10-CM | POA: Insufficient documentation

## 2021-04-07 DIAGNOSIS — Z79899 Other long term (current) drug therapy: Secondary | ICD-10-CM | POA: Insufficient documentation

## 2021-04-07 DIAGNOSIS — Z7984 Long term (current) use of oral hypoglycemic drugs: Secondary | ICD-10-CM | POA: Diagnosis not present

## 2021-04-07 DIAGNOSIS — E119 Type 2 diabetes mellitus without complications: Secondary | ICD-10-CM | POA: Diagnosis not present

## 2021-04-07 DIAGNOSIS — S9001XA Contusion of right ankle, initial encounter: Secondary | ICD-10-CM | POA: Diagnosis not present

## 2021-04-07 DIAGNOSIS — W228XXA Striking against or struck by other objects, initial encounter: Secondary | ICD-10-CM | POA: Diagnosis not present

## 2021-04-07 DIAGNOSIS — S99911A Unspecified injury of right ankle, initial encounter: Secondary | ICD-10-CM | POA: Diagnosis present

## 2021-04-07 MED ORDER — HYDROCODONE-ACETAMINOPHEN 5-325 MG PO TABS
1.0000 | ORAL_TABLET | Freq: Once | ORAL | Status: AC
  Administered 2021-04-07: 1 via ORAL
  Filled 2021-04-07: qty 1

## 2021-04-07 MED ORDER — ETODOLAC 400 MG PO TABS: 400.0000 mg | ORAL_TABLET | Freq: Two times a day (BID) | ORAL | 0 refills | Status: DC

## 2021-04-07 MED ORDER — HYDROCODONE-ACETAMINOPHEN 5-325 MG PO TABS: 1.0000 | ORAL_TABLET | Freq: Four times a day (QID) | ORAL | 0 refills | Status: DC | PRN

## 2021-04-07 NOTE — ED Provider Notes (Signed)
[facility] Emergency Department Provider Note   ____________________________________________   Event Date/Time   First MD Initiated Contact with Patient 04/07/21 0740     (approximate)  I have reviewed the triage vital signs and the nursing notes.   HISTORY  Chief Complaint Ankle Pain   HPI Edward Quinn is a 55 y.o. male Edward Quinn to the ED with complaint of right ankle pain.  Patient states that 2 days ago he hit his right ankle on a cart at work but had no problems immediately after.  He states that it began hurting yesterday and getting slightly red.  He states that this morning he is unable to weight-bear due to increased pain.  Patient has had gout in the past and the right lateral foot area but never in the ankle area.  Patient has not taken any over-the-counter medications this morning.  He rates pain as 7 out of 10.       Past Medical History:  Diagnosis Date  . Arthritis    hands  . Diabetes mellitus without complication (HCC)   . Diverticulitis   . GERD (gastroesophageal reflux disease)   . Hyperlipidemia   . Hypertension     Patient Active Problem List   Diagnosis Date Noted  . Diverticulitis 07/18/2019  . Diverticulitis of colon (without mention of hemorrhage)(562.11)   . Diverticulosis of large intestine without diverticulitis   . Diverticulitis of large intestine with perforation without bleeding   . Diverticulitis of colon with perforation 05/03/2016    Past Surgical History:  Procedure Laterality Date  . ACHILLES TENDON REPAIR    . APPENDECTOMY    . COLONOSCOPY WITH PROPOFOL N/A 09/12/2016   Procedure: COLONOSCOPY WITH PROPOFOL;  Surgeon: Midge Minium, MD;  Location: [facility];  Service: Endoscopy;  Laterality: N/A;  Diabetic    Prior to Admission medications   Medication Sig Start Date End Date Taking? Authorizing Provider  etodolac (LODINE) 400 MG tablet Take 1 tablet (400 mg total) by mouth 2 (two) times daily.  04/07/21  Yes Tommi Rumps, PA-C  HYDROcodone-acetaminophen (NORCO/VICODIN) 5-325 MG tablet Take 1 tablet by mouth every 6 (six) hours as needed for moderate pain. 04/07/21 04/07/22 Yes Tommi Rumps, PA-C  acetaminophen (TYLENOL) 325 MG tablet Take 1 tablet (325 mg total) by mouth every 6 (six) hours as needed for mild pain (or Fever >/= 101). 07/20/19   Gouru, Deanna Artis, MD  lisinopril (PRINIVIL,ZESTRIL) 10 MG tablet Take 10 mg by mouth daily. 04/12/16   [provider]  lovastatin (MEVACOR) 20 MG tablet Take 20 mg by mouth daily.     [provider]  metFORMIN (GLUCOPHAGE) 500 MG tablet Take 500 mg by mouth 2 (two) times daily with a meal. 08/15/19   [provider]  tamsulosin (FLOMAX) 0.4 MG CAPS capsule Take 0.4 mg by mouth daily. 01/19/20   [provider]    Allergies Patient has no known allergies.  Family History  Problem Relation Age of Onset  . Epilepsy Mother   . Cancer Paternal Aunt        lung    Social History Social History   Tobacco Use  . Smoking status: Never Smoker  . Smokeless tobacco: Never Used  Vaping Use  . Vaping Use: Never used  Substance Use Topics  . Alcohol use: Yes    Alcohol/week: 7.0 standard drinks    Types: 7 Cans of beer per week    Comment: ocass  . Drug use:  No    Review of Systems Constitutional: No fever/chills Eyes: No visual changes. Cardiovascular: Denies chest pain. Respiratory: Denies shortness of breath. Gastrointestinal: No abdominal pain.  No nausea, no vomiting.  Musculoskeletal: Positive right ankle pain. Skin: Erythema right ankle. Neurological: Negative for headaches, focal weakness or numbness. ____________________________________________   PHYSICAL EXAM:  VITAL SIGNS: ED Triage Vitals  Enc Vitals Group     BP 04/07/21 0738 (!) 147/99     Pulse Rate 04/07/21 0738 85     Resp 04/07/21 0738 16     Temp 04/07/21 0738 98.1 F (36.7 C)     Temp Source 04/07/21 0738 Oral     SpO2  04/07/21 0738 96 %     Weight 04/07/21 0732 163 lb (73.9 kg)     Height 04/07/21 0732 5\' 6"  (1.676 m)     Head Circumference --      Peak Flow --      Pain Score 04/07/21 0732 7     Pain Loc --      Pain Edu? --      Excl. in GC? --     Constitutional: Alert and oriented. Well appearing and in no acute distress. Eyes: Conjunctivae are normal.  Head: Atraumatic. Neck: No stridor.   Cardiovascular: Normal rate, regular rhythm. Grossly normal heart sounds.  Good peripheral circulation. Respiratory: Normal respiratory effort.  No retractions. Lungs CTAB. Musculoskeletal: On examination of the right ankle medial aspect there is erythema at the medial malleolus with soft tissue edema present.  Moderate tenderness to palpation.  No warmth is appreciated.  Skin appears to be intact.  No drainage from the site. Neurologic:  Normal speech and language. No gross focal neurologic deficits are appreciated. No gait instability. Skin:  Skin is warm, dry and intact. No rash noted. Psychiatric: Mood and affect are normal. Speech and behavior are normal.  ____________________________________________   LABS (all labs ordered are listed, but only abnormal results are displayed)  Labs Reviewed - No data to display ____________________________________________   RADIOLOGY I, 06/07/21, personally viewed and evaluated these images (plain radiographs) as part of my medical decision making, as well as reviewing the written report by the radiologist.   Official radiology report(s): DG Ankle Complete Right  Result Date: 04/07/2021 CLINICAL DATA:  Per Triage: Pt reports hit his right ankle on a cart right on the bone but didn't think much of it until it got red and started hurting worse and now he cannot put pressure on it. Pt reports he didn't think he hit it that hard. Pt also reports has had gout in the past but never in his ankle.Patients medial malleolus is red and slightly swollen. Patient shows  extreme distress when ankle is touched or moved. Patient able to moderately move for exam. EXAM: RIGHT ANKLE - COMPLETE 3+ VIEW COMPARISON:  None. FINDINGS: No fracture or bone lesion. Ankle joint normally spaced and aligned. Minor spurring from the anterior articular margin of the distal tibia. No other arthropathic change. Small dorsal and plantar calcaneal spurs. Mild medial soft tissue swelling suggested. IMPRESSION: 1. No fracture or dislocation. Electronically Signed   By: 06/07/2021 M.D.   On: 04/07/2021 08:21    ____________________________________________   PROCEDURES  Procedure(s) performed (including Critical Care):  Procedures Ace wrap was applied by provider.  ____________________________________________   INITIAL IMPRESSION / ASSESSMENT AND PLAN / ED COURSE  As part of my medical decision making, I reviewed the following data within the electronic  MEDICAL RECORD NUMBER Notes from prior ED visits and [place] Controlled Substance Database  55 year old male presents to the ED with complaint of right ankle pain.  Patient recently hit his medial ankle on a cart at work but continued to be ambulatory without any problems.  In the last 2 days area has become red and this morning he is unable to bear weight without increased pain.  X-ray was negative for acute fracture.  Ace wrap was applied and patient was given crutches.  He will begin etodolac 400 mg twice daily with food and Norco as needed for moderate pain.  He is to ice and elevate to reduce swelling and walk with crutches to prevent weightbearing at this time.  He is to follow-up with [facility] who is on-call today.  Patient was given a note to remain out of work for the next 2 days.  ____________________________________________   FINAL CLINICAL IMPRESSION(S) / ED DIAGNOSES  Final diagnoses:  Contusion of right ankle, initial encounter     ED Discharge Orders         Ordered    etodolac (LODINE) 400 MG tablet  2 times daily         04/07/21 0854    HYDROcodone-acetaminophen (NORCO/VICODIN) 5-325 MG tablet  Every 6 hours PRN        04/07/21 0854          *Please note:  Dickson Kostelnik was evaluated in Emergency Department on 04/07/2021 for the symptoms described in the history of present illness. He was evaluated in the context of the global COVID-19 pandemic, which necessitated consideration that the patient might be at risk for infection with the SARS-CoV-2 virus that causes COVID-19. Institutional protocols and algorithms that pertain to the evaluation of patients at risk for COVID-19 are in a state of rapid change based on information released by regulatory bodies including the CDC and federal and state organizations. These policies and algorithms were followed during the patient's care in the ED.  Some ED evaluations and interventions may be delayed as a result of limited staffing during and the pandemic.*   Note:  This document was prepared using Dragon voice recognition software and may include unintentional dictation errors.    Tommi Rumps, PA-C 04/07/21 1334    Delton Prairie, MD 04/07/21 1346

## 2021-04-07 NOTE — ED Triage Notes (Signed)
Pt reports hit his right ankle on a cart right on the bone but didn't think much of it until it got red and started hurting worse and now he cannot put pressure on it. Pt reports he didn't think he hit it that hard. Pt also reports has had gout in the past but never in his ankle.

## 2021-04-07 NOTE — Discharge Instructions (Signed)
Use crutches and wear Ace wrap for the next 2 days.  Ice and elevation to reduce swelling.  Begin taking the etodolac 400 mg twice daily with food and hydrocodone every 6 hours as needed for moderate pain.  Do not drive or operate machinery while taking the hydrocodone as it could cause drowsiness and increase your risk for injury.  You may follow-up with [facility] if any continued problems or not improving.

## 2022-02-17 ENCOUNTER — Emergency Department
Admission: EM | Admit: 2022-02-17 | Discharge: 2022-02-17 | Disposition: A | Discharge status: Home / Self Care | Payer: Commercial Managed Care - PPO | Attending: Emergency Medicine | Admitting: Emergency Medicine

## 2022-02-17 ENCOUNTER — Other Ambulatory Visit: Payer: Self-pay

## 2022-02-17 ENCOUNTER — Encounter: Payer: Self-pay | Admitting: Emergency Medicine

## 2022-02-17 ENCOUNTER — Emergency Department: Payer: Commercial Managed Care - PPO

## 2022-02-17 DIAGNOSIS — E119 Type 2 diabetes mellitus without complications: Secondary | ICD-10-CM | POA: Diagnosis not present

## 2022-02-17 DIAGNOSIS — M109 Gout, unspecified: Secondary | ICD-10-CM | POA: Insufficient documentation

## 2022-02-17 DIAGNOSIS — I10 Essential (primary) hypertension: Secondary | ICD-10-CM | POA: Insufficient documentation

## 2022-02-17 DIAGNOSIS — M25571 Pain in right ankle and joints of right foot: Secondary | ICD-10-CM | POA: Diagnosis present

## 2022-02-17 MED ORDER — KETOROLAC TROMETHAMINE 30 MG/ML IJ SOLN
30.0000 mg | Freq: Once | INTRAMUSCULAR | Status: AC
  Administered 2022-02-17: 30 mg via INTRAMUSCULAR
  Filled 2022-02-17: qty 1

## 2022-02-17 MED ORDER — COLCHICINE 0.6 MG PO TABS: 0.6000 mg | ORAL_TABLET | Freq: Two times a day (BID) | ORAL | 0 refills | Status: AC

## 2022-02-17 MED ORDER — HYDROCODONE-ACETAMINOPHEN 5-325 MG PO TABS: 1.0000 | ORAL_TABLET | ORAL | 0 refills | Status: AC | PRN

## 2022-02-17 NOTE — ED Triage Notes (Signed)
Pt via POV from home. Pt states he woke up this AM and could bear  any weight on his R ankle. Denies injury. Pt is A&OX4 and NAD.  ?

## 2022-02-17 NOTE — ED Notes (Signed)
See triage note  presents with pain to right ankle  denies any injury  area is red and tender at ankle area  area is tender on palpation   no injury unable to bear wt  good pulses ?

## 2022-02-17 NOTE — Discharge Instructions (Signed)
Follow-up with your primary care provider if any continued problems.  Begin taking medication as directed.  The hydrocodone is for pain only.  Begin taking the colchicine twice a day until your pain subsides.  This is medicine specifically for gout.  Do not drive or operate machinery while taking the hydrocodone as it could cause drowsiness.  Elevate your ankle as needed for pain. ?

## 2022-02-17 NOTE — ED Provider Notes (Signed)
? ?[facility] ?Provider Note ? ? ? Event Date/Time  ? First MD Initiated Contact with Patient 02/17/22 [phone]   ?  (approximate) ? ? ?History  ? ?Ankle Pain ? ? ?HPI ? ?Edward Quinn is a 56 y.o. male   presents to the ED with complaint of right ankle pain.  Patient states he woke this morning and could not bear weight.  He is unaware of any known injury that occurred yesterday.  Patient is aware that he has arthritis and also has had an Achilles tendon repair on the same extremity.  Patient has history of diabetes mellitus without complication, diverticulitis, GERD, hyperlipidemia and hypertension.  Currently rates pain as a 4 out of 10 and is walking with the aid of a crutch. ? ?  ? ? ?Physical Exam  ? ?Triage Vital Signs: ?ED Triage Vitals  ?Enc Vitals Group  ?   BP 02/17/22 0742 (!) 132/92  ?   Pulse Rate 02/17/22 0742 81  ?   Resp 02/17/22 0742 18  ?   Temp 02/17/22 0742 98.3 ?F (36.8 ?C)  ?   Temp Source 02/17/22 0742 Oral  ?   SpO2 02/17/22 0742 100 %  ?   Weight 02/17/22 0738 165 lb (74.8 kg)  ?   Height 02/17/22 0738 5\' 6"  (1.676 m)  ?   Head Circumference --   ?   Peak Flow --   ?   Pain Score 02/17/22 0737 4  ?   Pain Loc --   ?   Pain Edu? --   ?   Excl. in [place]? --   ? ? ?Most recent vital signs: ?Vitals:  ? 02/17/22 0742  ?BP: (!) 132/92  ?Pulse: 81  ?Resp: 18  ?Temp: 98.3 ?F (36.8 ?C)  ?SpO2: 100%  ? ? ? ?General: Awake, no distress.  Unable to bear weight on right ankle. ?CV:  Good peripheral perfusion.  Heart regular rate and rhythm. ?Resp:  Normal effort.  Lungs are clear bilaterally. ?Abd:  No distention.  ?Other:  On examination of the right ankle there is no gross deformity however there is an erythematous area at the medial malleolus that is markedly tender to touch and warm.  Skin is intact.  Range of motion is slightly restricted secondary to increased pain.  Pulses are present distally. ? ? ?ED Results / Procedures / Treatments  ? ?Labs ?(all labs ordered are listed, but  only abnormal results are displayed) ?Labs Reviewed - No data to display ? ? ?RADIOLOGY ?Right ankle x-ray was reviewed by myself and no acute bony injury was noted.  Radiology report is negative with the exception of some degenerative changes. ? ? ? ?PROCEDURES: ? ?Critical Care performed:  ? ?Procedures ? ? ?MEDICATIONS ORDERED IN ED: ?Medications  ?ketorolac (TORADOL) 30 MG/ML injection 30 mg (30 mg Intramuscular Given 02/17/22 0758)  ? ? ? ?IMPRESSION / MDM / ASSESSMENT AND PLAN / ED COURSE  ?I reviewed the triage vital signs and the nursing notes. ? ? ?Differential diagnosis includes, but is not limited to, acute gout, occult fracture, degenerative arthritis. ? ?56 year old male presents to the ED with complaint of sudden onset right ankle pain that began early this morning and he is unable to bear weight.  Patient believes that he has had a bout of gout approximately 10 years ago.  He denies any known injury to his ankle.  Area is warm, erythematous and markedly tender to touch.  No evidence of injury.  Patient was given Toradol 30 mg IM while in the ED.  A prescription for colchicine and hydrocodone was sent to the pharmacy.  He is to follow-up with his PCP if any continued problems or not improving.  He also was given a note to remain out of work so that he can elevate his foot and ankle today. ? ? ? ?  ? ? ?FINAL CLINICAL IMPRESSION(S) / ED DIAGNOSES  ? ?Final diagnoses:  ?Acute gout of right ankle, unspecified cause  ? ? ? ?Rx / DC Orders  ? ?ED Discharge Orders   ? ?      Ordered  ?  HYDROcodone-acetaminophen (NORCO/VICODIN) 5-325 MG tablet  Every 4 hours PRN       ? 02/17/22 0815  ?  colchicine 0.6 MG tablet  2 times daily       ? 02/17/22 0815  ? ?  ?  ? ?  ? ? ? ?Note:  This document was prepared using Dragon voice recognition software and may include unintentional dictation errors. ?  ?Johnn Hai, PA-C ?02/17/22 1039 ? ?  ?Lavonia Drafts, MD ?02/17/22 1148 ? ?

## 2024-11-08 ENCOUNTER — Other Ambulatory Visit: Payer: Self-pay

## 2024-11-08 ENCOUNTER — Emergency Department: Admission: EM | Admit: 2024-11-08 | Discharge: 2024-11-08 | Disposition: A | Discharge status: Home / Self Care

## 2024-11-08 ENCOUNTER — Emergency Department

## 2024-11-08 DIAGNOSIS — J069 Acute upper respiratory infection, unspecified: Secondary | ICD-10-CM

## 2024-11-08 LAB — RESP PANEL BY RT-PCR (RSV, FLU A&B, COVID)  RVPGX2
Influenza A by PCR: NEGATIVE
Influenza B by PCR: NEGATIVE
Resp Syncytial Virus by PCR: NEGATIVE
SARS Coronavirus 2 by RT PCR: NEGATIVE

## 2024-11-08 MED ORDER — BENZONATATE 200 MG PO CAPS: 200.0000 mg | ORAL_CAPSULE | Freq: Three times a day (TID) | ORAL | 0 refills | Status: AC | PRN

## 2024-11-08 NOTE — Discharge Instructions (Signed)
 Follow-up with your primary care provider if any continued problems or concerns.  Increase fluids to stay hydrated.  A prescription for Tessalon  Perles was sent to the pharmacy for you to take every 8 hours as needed for cough.  You may also take Tylenol  or ibuprofen if needed for body aches, headache or fever.

## 2024-11-08 NOTE — ED Provider Notes (Signed)
 [facility] Provider Note    Event Date/Time   First MD Initiated Contact with Patient 11/08/24 0719     (approximate)   History   Cough   HPI  Edward Quinn is a 58 y.o. male   presents to the ED with complaint of cough and head congestion that started last night.  He states that the cough hurts his chest to cough.  He is uncertain of any fever.  He states that his family has been diagnosed with RSV 2 weeks ago.  No over-the-counter medications been taken.  Patient has history of hypertension, diabetes, arthritis, GERD.      Physical Exam   Triage Vital Signs: ED Triage Vitals [11/08/24 0719]  Encounter Vitals Group     BP (!) 153/108     Girls Systolic BP Percentile      Girls Diastolic BP Percentile      Boys Systolic BP Percentile      Boys Diastolic BP Percentile      Pulse Rate 89     Resp 20     Temp 98.1 F (36.7 C)     Temp src      SpO2 98 %     Weight 160 lb (72.6 kg)     Height 5' 6 (1.676 m)     Head Circumference      Peak Flow      Pain Score 0     Pain Loc      Pain Education      Exclude from Growth Chart     Most recent vital signs: Vitals:   11/08/24 0719 11/08/24 0820  BP: (!) 153/108 (!) 127/91  Pulse: 89   Resp: 20   Temp: 98.1 F (36.7 C)   SpO2: 98%      General: Awake, no distress.  CV:  Good peripheral perfusion.  Heart rate and rhythm. Resp:  Normal effort.  Lungs are clear bilaterally. Abd:  No distention.  Other:     ED Results / Procedures / Treatments   Labs (all labs ordered are listed, but only abnormal results are displayed) Labs Reviewed  RESP PANEL BY RT-PCR (RSV, FLU A&B, COVID)  [id number]     RADIOLOGY  Chest x-ray images were reviewed and interpreted by myself independent of the radiologist and was negative for acute cardiopulmonary changes.  Official radiology report is negative.   PROCEDURES:  Critical Care performed:   Procedures   MEDICATIONS ORDERED IN  ED: Medications - No data to display   IMPRESSION / MDM / ASSESSMENT AND PLAN / ED COURSE  I reviewed the triage vital signs and the nursing notes.   Differential diagnosis includes, but is not limited to, COVID, RSV, influenza, upper respiratory infection, viral illness, bronchitis, pneumonia.  58 year old male presents to the ED with complaint of upper respiratory symptoms that started last evening.  He is specifically concerned of RSV as he has been exposed in the last 2 weeks.  He also is reassured with his chest x-ray being negative because he thought that his cough was aggravating his lungs.  Respiratory panel is negative.  A prescription for Tessalon  Perles was sent to the pharmacy for him to begin taking as needed for cough.  Tylenol  or ibuprofen and increase fluids.  He is to follow-up with his PCP if any continued problems.      Patient's presentation is most consistent with acute complicated illness / injury requiring diagnostic workup.  FINAL CLINICAL IMPRESSION(S) /  ED DIAGNOSES   Final diagnoses:  Viral URI with cough     Rx / DC Orders   ED Discharge Orders          Ordered    benzonatate  (TESSALON ) 200 MG capsule  3 times daily PRN        11/08/24 0830             Note:  This document was prepared using Dragon voice recognition software and may include unintentional dictation errors.   Saunders Shona CROME, PA-C 11/08/24 9146    Fernand Rossie HERO, MD 11/08/24 [phone]

## 2024-11-08 NOTE — ED Notes (Signed)
 See triage note  Presents with cough  States he developed cold sxs' this am   States he is concerned because he is having pain in his lungs

## 2024-11-08 NOTE — ED Triage Notes (Signed)
 Pt to ED for cough started last night, head congestion. Reports pain with cough. Family has RSV
